# Patient Record
Sex: Male | Born: 1935 | Race: White | Hispanic: No | Smoking: Former smoker
Health system: Southern US, Community
[De-identification: ages and names within clinical notes are randomized; demographics above are authoritative.]

## PROBLEM LIST (undated history)

## (undated) DIAGNOSIS — E039 Hypothyroidism, unspecified: Secondary | ICD-10-CM

## (undated) DIAGNOSIS — F419 Anxiety disorder, unspecified: Secondary | ICD-10-CM

## (undated) DIAGNOSIS — E079 Disorder of thyroid, unspecified: Secondary | ICD-10-CM

## (undated) DIAGNOSIS — C189 Malignant neoplasm of colon, unspecified: Secondary | ICD-10-CM

## (undated) DIAGNOSIS — E119 Type 2 diabetes mellitus without complications: Secondary | ICD-10-CM

## (undated) DIAGNOSIS — F0390 Unspecified dementia without behavioral disturbance: Secondary | ICD-10-CM

## (undated) DIAGNOSIS — I509 Heart failure, unspecified: Secondary | ICD-10-CM

## (undated) DIAGNOSIS — F329 Major depressive disorder, single episode, unspecified: Secondary | ICD-10-CM

## (undated) DIAGNOSIS — E7800 Pure hypercholesterolemia, unspecified: Secondary | ICD-10-CM

## (undated) DIAGNOSIS — I2510 Atherosclerotic heart disease of native coronary artery without angina pectoris: Secondary | ICD-10-CM

## (undated) DIAGNOSIS — I639 Cerebral infarction, unspecified: Secondary | ICD-10-CM

## (undated) DIAGNOSIS — F32A Depression, unspecified: Secondary | ICD-10-CM

## (undated) DIAGNOSIS — Z950 Presence of cardiac pacemaker: Secondary | ICD-10-CM

## (undated) DIAGNOSIS — I10 Essential (primary) hypertension: Secondary | ICD-10-CM

## (undated) HISTORY — PX: COLON SURGERY: SHX602

---

## 1983-03-16 HISTORY — PX: CORONARY ARTERY BYPASS GRAFT: SHX141

## 2008-03-15 HISTORY — PX: CARDIAC CATHETERIZATION: SHX172

## 2008-03-15 HISTORY — PX: INSERT / REPLACE / REMOVE PACEMAKER: SUR710

## 2015-01-14 ENCOUNTER — Emergency Department (HOSPITAL_BASED_OUTPATIENT_CLINIC_OR_DEPARTMENT_OTHER): Payer: Medicare Other

## 2015-01-14 ENCOUNTER — Inpatient Hospital Stay (HOSPITAL_BASED_OUTPATIENT_CLINIC_OR_DEPARTMENT_OTHER)
Admission: EM | Admit: 2015-01-14 | Discharge: 2015-01-16 | DRG: 281 | Disposition: A | Discharge status: Skilled Nursing Facility | Payer: Medicare Other | Attending: Internal Medicine | Admitting: Internal Medicine

## 2015-01-14 ENCOUNTER — Encounter (HOSPITAL_BASED_OUTPATIENT_CLINIC_OR_DEPARTMENT_OTHER): Payer: Self-pay | Admitting: *Deleted

## 2015-01-14 DIAGNOSIS — Z950 Presence of cardiac pacemaker: Secondary | ICD-10-CM

## 2015-01-14 DIAGNOSIS — I779 Disorder of arteries and arterioles, unspecified: Secondary | ICD-10-CM | POA: Diagnosis not present

## 2015-01-14 DIAGNOSIS — I6521 Occlusion and stenosis of right carotid artery: Secondary | ICD-10-CM | POA: Diagnosis present

## 2015-01-14 DIAGNOSIS — Z7902 Long term (current) use of antithrombotics/antiplatelets: Secondary | ICD-10-CM | POA: Diagnosis not present

## 2015-01-14 DIAGNOSIS — Z951 Presence of aortocoronary bypass graft: Secondary | ICD-10-CM | POA: Diagnosis not present

## 2015-01-14 DIAGNOSIS — M79605 Pain in left leg: Secondary | ICD-10-CM | POA: Diagnosis present

## 2015-01-14 DIAGNOSIS — E039 Hypothyroidism, unspecified: Secondary | ICD-10-CM

## 2015-01-14 DIAGNOSIS — Z7982 Long term (current) use of aspirin: Secondary | ICD-10-CM | POA: Diagnosis not present

## 2015-01-14 DIAGNOSIS — Z515 Encounter for palliative care: Secondary | ICD-10-CM | POA: Insufficient documentation

## 2015-01-14 DIAGNOSIS — Z87891 Personal history of nicotine dependence: Secondary | ICD-10-CM

## 2015-01-14 DIAGNOSIS — I482 Chronic atrial fibrillation, unspecified: Secondary | ICD-10-CM

## 2015-01-14 DIAGNOSIS — I480 Paroxysmal atrial fibrillation: Secondary | ICD-10-CM | POA: Diagnosis present

## 2015-01-14 DIAGNOSIS — N400 Enlarged prostate without lower urinary tract symptoms: Secondary | ICD-10-CM | POA: Diagnosis present

## 2015-01-14 DIAGNOSIS — R29898 Other symptoms and signs involving the musculoskeletal system: Secondary | ICD-10-CM

## 2015-01-14 DIAGNOSIS — Z8673 Personal history of transient ischemic attack (TIA), and cerebral infarction without residual deficits: Secondary | ICD-10-CM

## 2015-01-14 DIAGNOSIS — R072 Precordial pain: Secondary | ICD-10-CM | POA: Diagnosis not present

## 2015-01-14 DIAGNOSIS — I110 Hypertensive heart disease with heart failure: Secondary | ICD-10-CM | POA: Diagnosis present

## 2015-01-14 DIAGNOSIS — I5022 Chronic systolic (congestive) heart failure: Secondary | ICD-10-CM | POA: Diagnosis present

## 2015-01-14 DIAGNOSIS — I25708 Atherosclerosis of coronary artery bypass graft(s), unspecified, with other forms of angina pectoris: Secondary | ICD-10-CM | POA: Diagnosis not present

## 2015-01-14 DIAGNOSIS — Z85038 Personal history of other malignant neoplasm of large intestine: Secondary | ICD-10-CM

## 2015-01-14 DIAGNOSIS — I214 Non-ST elevation (NSTEMI) myocardial infarction: Principal | ICD-10-CM | POA: Diagnosis present

## 2015-01-14 DIAGNOSIS — E785 Hyperlipidemia, unspecified: Secondary | ICD-10-CM | POA: Diagnosis present

## 2015-01-14 DIAGNOSIS — I10 Essential (primary) hypertension: Secondary | ICD-10-CM

## 2015-01-14 DIAGNOSIS — F419 Anxiety disorder, unspecified: Secondary | ICD-10-CM | POA: Diagnosis present

## 2015-01-14 DIAGNOSIS — I2510 Atherosclerotic heart disease of native coronary artery without angina pectoris: Secondary | ICD-10-CM | POA: Diagnosis present

## 2015-01-14 DIAGNOSIS — F0280 Dementia in other diseases classified elsewhere without behavioral disturbance: Secondary | ICD-10-CM | POA: Diagnosis not present

## 2015-01-14 DIAGNOSIS — I739 Peripheral vascular disease, unspecified: Secondary | ICD-10-CM

## 2015-01-14 DIAGNOSIS — E119 Type 2 diabetes mellitus without complications: Secondary | ICD-10-CM | POA: Diagnosis present

## 2015-01-14 DIAGNOSIS — F329 Major depressive disorder, single episode, unspecified: Secondary | ICD-10-CM | POA: Diagnosis present

## 2015-01-14 DIAGNOSIS — F0390 Unspecified dementia without behavioral disturbance: Secondary | ICD-10-CM

## 2015-01-14 DIAGNOSIS — G3183 Dementia with Lewy bodies: Secondary | ICD-10-CM | POA: Diagnosis present

## 2015-01-14 DIAGNOSIS — I272 Other secondary pulmonary hypertension: Secondary | ICD-10-CM | POA: Diagnosis not present

## 2015-01-14 DIAGNOSIS — R079 Chest pain, unspecified: Secondary | ICD-10-CM | POA: Diagnosis not present

## 2015-01-14 HISTORY — DX: Major depressive disorder, single episode, unspecified: F32.9

## 2015-01-14 HISTORY — DX: Hypothyroidism, unspecified: E03.9

## 2015-01-14 HISTORY — DX: Essential (primary) hypertension: I10

## 2015-01-14 HISTORY — DX: Unspecified dementia, unspecified severity, without behavioral disturbance, psychotic disturbance, mood disturbance, and anxiety: F03.90

## 2015-01-14 HISTORY — DX: Disorder of thyroid, unspecified: E07.9

## 2015-01-14 HISTORY — DX: Type 2 diabetes mellitus without complications: E11.9

## 2015-01-14 HISTORY — DX: Pure hypercholesterolemia, unspecified: E78.00

## 2015-01-14 HISTORY — DX: Atherosclerotic heart disease of native coronary artery without angina pectoris: I25.10

## 2015-01-14 HISTORY — DX: Presence of cardiac pacemaker: Z95.0

## 2015-01-14 HISTORY — DX: Anxiety disorder, unspecified: F41.9

## 2015-01-14 HISTORY — DX: Heart failure, unspecified: I50.9

## 2015-01-14 HISTORY — DX: Depression, unspecified: F32.A

## 2015-01-14 HISTORY — DX: Cerebral infarction, unspecified: I63.9

## 2015-01-14 HISTORY — DX: Malignant neoplasm of colon, unspecified: C18.9

## 2015-01-14 LAB — CBC
HCT: 40.2 % (ref 39.0–52.0)
HEMOGLOBIN: 13.3 g/dL (ref 13.0–17.0)
MCH: 31 pg (ref 26.0–34.0)
MCHC: 33.1 g/dL (ref 30.0–36.0)
MCV: 93.7 fL (ref 78.0–100.0)
Platelets: 154 10*3/uL (ref 150–400)
RBC: 4.29 MIL/uL (ref 4.22–5.81)
RDW: 15.8 % — ABNORMAL HIGH (ref 11.5–15.5)
WBC: 7.1 10*3/uL (ref 4.0–10.5)

## 2015-01-14 LAB — URINALYSIS, ROUTINE W REFLEX MICROSCOPIC
Bilirubin Urine: NEGATIVE
GLUCOSE, UA: NEGATIVE mg/dL
HGB URINE DIPSTICK: NEGATIVE
Ketones, ur: NEGATIVE mg/dL
Nitrite: NEGATIVE
PH: 6 (ref 5.0–8.0)
Protein, ur: NEGATIVE mg/dL
Specific Gravity, Urine: 1.019 (ref 1.005–1.030)
Urobilinogen, UA: 0.2 mg/dL (ref 0.0–1.0)

## 2015-01-14 LAB — URINE MICROSCOPIC-ADD ON

## 2015-01-14 LAB — CBC WITH DIFFERENTIAL/PLATELET
Basophils Absolute: 0.1 10*3/uL (ref 0.0–0.1)
Basophils Relative: 1 %
Eosinophils Absolute: 0.2 10*3/uL (ref 0.0–0.7)
Eosinophils Relative: 2 %
HEMATOCRIT: 39.8 % (ref 39.0–52.0)
HEMOGLOBIN: 13.2 g/dL (ref 13.0–17.0)
LYMPHS ABS: 2.2 10*3/uL (ref 0.7–4.0)
Lymphocytes Relative: 30 %
MCH: 30.8 pg (ref 26.0–34.0)
MCHC: 33.2 g/dL (ref 30.0–36.0)
MCV: 92.8 fL (ref 78.0–100.0)
MONO ABS: 0.7 10*3/uL (ref 0.1–1.0)
MONOS PCT: 10 %
NEUTROS ABS: 4.2 10*3/uL (ref 1.7–7.7)
NEUTROS PCT: 57 %
Platelets: 161 10*3/uL (ref 150–400)
RBC: 4.29 MIL/uL (ref 4.22–5.81)
RDW: 15.9 % — AB (ref 11.5–15.5)
WBC: 7.3 10*3/uL (ref 4.0–10.5)

## 2015-01-14 LAB — COMPREHENSIVE METABOLIC PANEL
ALBUMIN: 3.6 g/dL (ref 3.5–5.0)
ALK PHOS: 129 U/L — AB (ref 38–126)
ALT: 19 U/L (ref 17–63)
AST: 28 U/L (ref 15–41)
Anion gap: 8 (ref 5–15)
BILIRUBIN TOTAL: 0.8 mg/dL (ref 0.3–1.2)
BUN: 18 mg/dL (ref 6–20)
CHLORIDE: 107 mmol/L (ref 101–111)
CO2: 25 mmol/L (ref 22–32)
CREATININE: 1.07 mg/dL (ref 0.61–1.24)
Calcium: 9.1 mg/dL (ref 8.9–10.3)
GFR calc Af Amer: >60 mL/min (ref >60)
GFR calc non Af Amer: 59 mL/min — ABNORMAL LOW (ref >60)
Glucose, Bld: 92 mg/dL (ref 65–99)
POTASSIUM: 4.2 mmol/L (ref 3.5–5.1)
Sodium: 140 mmol/L (ref 135–145)
Total Protein: 7.3 g/dL (ref 6.5–8.1)

## 2015-01-14 LAB — CREATININE, SERUM
CREATININE: 0.91 mg/dL (ref 0.61–1.24)
GFR calc Af Amer: >60 mL/min (ref >60)

## 2015-01-14 LAB — DIGOXIN LEVEL: Digoxin Level: 0.8 ng/mL (ref 0.8–2.0)

## 2015-01-14 LAB — PROTIME-INR
INR: 1.27 (ref 0.00–1.49)
Prothrombin Time: 16 seconds — ABNORMAL HIGH (ref 11.6–15.2)

## 2015-01-14 LAB — T4, FREE: FREE T4: 0.84 ng/dL (ref 0.61–1.12)

## 2015-01-14 LAB — TSH: TSH: 4.233 u[IU]/mL (ref 0.350–4.500)

## 2015-01-14 LAB — APTT: aPTT: 36 seconds (ref 24–37)

## 2015-01-14 LAB — TROPONIN I
TROPONIN I: 0.04 ng/mL — AB (ref <0.031)
Troponin I: 0.03 ng/mL (ref <0.031)

## 2015-01-14 LAB — CBG MONITORING, ED: Glucose-Capillary: 84 mg/dL (ref 65–99)

## 2015-01-14 MED ORDER — CLOPIDOGREL BISULFATE 75 MG PO TABS
75.0000 mg | ORAL_TABLET | Freq: Every day | ORAL | Status: DC
  Administered 2015-01-15 – 2015-01-16 (×2): 75 mg via ORAL
  Filled 2015-01-14 (×2): qty 1

## 2015-01-14 MED ORDER — QUETIAPINE FUMARATE 25 MG PO TABS
25.0000 mg | ORAL_TABLET | Freq: Three times a day (TID) | ORAL | Status: DC
  Administered 2015-01-14 – 2015-01-15 (×4): 25 mg via ORAL
  Filled 2015-01-14 (×5): qty 1

## 2015-01-14 MED ORDER — TAMSULOSIN HCL 0.4 MG PO CAPS
0.4000 mg | ORAL_CAPSULE | Freq: Every day | ORAL | Status: DC
  Administered 2015-01-14 – 2015-01-15 (×2): 0.4 mg via ORAL
  Filled 2015-01-14 (×2): qty 1

## 2015-01-14 MED ORDER — ONDANSETRON HCL 4 MG/2ML IJ SOLN: 4.0000 mg | Freq: Four times a day (QID) | INTRAMUSCULAR | Status: DC | PRN

## 2015-01-14 MED ORDER — ZOLPIDEM TARTRATE 5 MG PO TABS: 5.0000 mg | ORAL_TABLET | Freq: Every evening | ORAL | Status: DC | PRN

## 2015-01-14 MED ORDER — SODIUM CHLORIDE 0.9 % IV SOLN
INTRAVENOUS | Status: DC
  Administered 2015-01-14: 23:00:00 via INTRAVENOUS

## 2015-01-14 MED ORDER — NITROGLYCERIN 0.4 MG SL SUBL: 0.4000 mg | SUBLINGUAL_TABLET | SUBLINGUAL | Status: DC | PRN

## 2015-01-14 MED ORDER — ACETAMINOPHEN 325 MG PO TABS: 650.0000 mg | ORAL_TABLET | ORAL | Status: DC | PRN

## 2015-01-14 MED ORDER — CITALOPRAM HYDROBROMIDE 20 MG PO TABS
40.0000 mg | ORAL_TABLET | Freq: Every day | ORAL | Status: DC
  Administered 2015-01-15: 40 mg via ORAL
  Filled 2015-01-14: qty 2

## 2015-01-14 MED ORDER — ATORVASTATIN CALCIUM 20 MG PO TABS
20.0000 mg | ORAL_TABLET | Freq: Every day | ORAL | Status: DC
  Administered 2015-01-15 – 2015-01-16 (×2): 20 mg via ORAL
  Filled 2015-01-14 (×2): qty 1

## 2015-01-14 MED ORDER — HEPARIN SODIUM (PORCINE) 5000 UNIT/ML IJ SOLN
5000.0000 [IU] | Freq: Three times a day (TID) | INTRAMUSCULAR | Status: DC
  Administered 2015-01-14 – 2015-01-16 (×5): 5000 [IU] via SUBCUTANEOUS
  Filled 2015-01-14 (×5): qty 1

## 2015-01-14 MED ORDER — FUROSEMIDE 20 MG PO TABS
20.0000 mg | ORAL_TABLET | Freq: Every day | ORAL | Status: DC
  Administered 2015-01-15 – 2015-01-16 (×2): 20 mg via ORAL
  Filled 2015-01-14 (×2): qty 1

## 2015-01-14 MED ORDER — SODIUM CHLORIDE 0.9 % IV BOLUS (SEPSIS): 1000.0000 mL | Freq: Once | INTRAVENOUS | Status: DC

## 2015-01-14 MED ORDER — DIGOXIN 250 MCG PO TABS: 0.2500 mg | ORAL_TABLET | Freq: Every day | ORAL | Status: DC

## 2015-01-14 MED ORDER — ASPIRIN EC 81 MG PO TBEC
81.0000 mg | DELAYED_RELEASE_TABLET | Freq: Every day | ORAL | Status: DC
  Administered 2015-01-15 – 2015-01-16 (×2): 81 mg via ORAL
  Filled 2015-01-14 (×2): qty 1

## 2015-01-14 MED ORDER — LEVOTHYROXINE SODIUM 50 MCG PO TABS
50.0000 ug | ORAL_TABLET | Freq: Every day | ORAL | Status: DC
  Administered 2015-01-15 – 2015-01-16 (×2): 50 ug via ORAL
  Filled 2015-01-14 (×2): qty 1

## 2015-01-14 NOTE — ED Notes (Signed)
Patient transported to X-ray 

## 2015-01-14 NOTE — H&P (Addendum)
[facility] History and Physical  Kenneth Parrish EAV:[id number] DOB: January 16, 1924 DOA: 01/14/2015  Referring physician: Debby Freiberg, MD PCP: No primary care provider on file.   Chief Complaint: Abnormal Troponin  HPI: Kenneth Parrish is a 81 y.o. male with history of dementia DM II HTN presents with an abnormal troponin. Patients daughter statrs that today the patient was at home and started to complain of having pain in his foot. Patients caregiver today noted that some thing was wrong when his caregiver was giving him a shower. Patients leg apparently gave oout and he states to complain of having left side pain. Patients daughter is not sure if he was having chest pain. Patient was also noted to have a low blood pressure. He was not able to really provide a history due to his dementia. Patient was initially evaluated for his foot pain. His daughter asked for an orthostatic pressure and was asked to walk to the bathroom. At that time his caregiver noted that he went limp. He did not lose consciousness and had no complaint of chest pain. Patient did have some shortness of breath noted. Patient had no headaches noted. He only had this vague complaint of pain on his left side. He has a history of Stage 4 colon Ca back in 1997. The dauhgter hs noted increased episodes of falls and his increased weakness. Patient right now lives at home with aides helping. She however wants to consider short term rehab as his level of care is increasing.    Review of Systems:  Patietn is not able to provide a ROS due to his dementia.   Past Medical History  Diagnosis Date  . Dementia   . Colon cancer ([place])   . Diabetes mellitus without complication ([place])   . High cholesterol   . Hypertension   . Thyroid disease    No past surgical history on file. Social History:  has no tobacco, alcohol, and drug history on file.  No Known Allergies  No family history on file.   Prior to Admission medications     Medication Sig Start Date End Date Taking? Authorizing Provider  aspirin 81 MG tablet Take 81 mg by mouth daily.   Yes Historical Provider, MD  atorvastatin (LIPITOR) 40 MG tablet Take 20 mg by mouth daily.   Yes Historical Provider, MD  citalopram (CELEXA) 40 MG tablet Take 40 mg by mouth daily.   Yes Historical Provider, MD  clopidogrel (PLAVIX) 75 MG tablet Take 75 mg by mouth daily.   Yes Historical Provider, MD  digoxin (LANOXIN) 0.25 MG tablet Take 0.25 mg by mouth daily.   Yes Historical Provider, MD  furosemide (LASIX) 20 MG tablet Take 20 mg by mouth as needed.   Yes Historical Provider, MD  levothyroxine (SYNTHROID, LEVOTHROID) 50 MCG tablet Take 50 mcg by mouth daily before breakfast.   Yes Historical Provider, MD  Melatonin 5 MG CAPS Take by mouth.   Yes Historical Provider, MD  Multiple Vitamins-Minerals (CENTRUM SILVER PO) Take by mouth.   Yes Historical Provider, MD  QUEtiapine (SEROQUEL) 25 MG tablet Take 25 mg by mouth 3 times/day as needed-between meals & bedtime.   Yes Historical Provider, MD  tamsulosin (FLOMAX) 0.4 MG CAPS capsule Take 0.4 mg by mouth Nightly.   Yes Historical Provider, MD   Physical Exam: Filed Vitals:   01/14/15 1251 01/14/15 1634 01/14/15 1915 01/14/15 2100  BP: 102/60  118/72 144/76  Pulse: 70  73 77  Temp: 98.5 F (36.9 C)  99.6 F (37.6 C)  TempSrc: Oral   Oral  Resp: 18 20  22   Height: 5\' 6"  (1.676 m)   5\' 8"  (1.727 m)  Weight: 80.287 kg (177 lb)   78.6 kg (173 lb 4.5 oz)  SpO2: 96% 93% 96% 97%    Wt Readings from Last 3 Encounters:  01/14/15 78.6 kg (173 lb 4.5 oz)    General:  Appears calm and comfortable Eyes: PERRL, normal lids, irises & conjunctiva ENT: grossly normal hearing, lips & tongue Neck: no LAD, masses or thyromegaly Cardiovascular: RRR, no m/r/g. No LE edema. Respiratory: CTA bilaterally, no w/r/r. Normal respiratory effort. Abdomen: soft, ntnd Skin: no rash or induration seen on limited exam Musculoskeletal:  grossly normal tone BUE/BLE Psychiatric: awake pleasant confused Neurologic: moves all four extremities          Labs on Admission:  Basic Metabolic Panel:  Recent Labs Lab 01/14/15 1520  NA 140  K 4.2  CL 107  CO2 25  GLUCOSE 92  BUN 18  CREATININE 1.07  CALCIUM 9.1   Liver Function Tests:  Recent Labs Lab 01/14/15 1520  AST 28  ALT 19  ALKPHOS 129*  BILITOT 0.8  PROT 7.3  ALBUMIN 3.6   No results for input(s): LIPASE, AMYLASE in the last 168 hours. No results for input(s): AMMONIA in the last 168 hours. CBC:  Recent Labs Lab 01/14/15 1520  WBC 7.3  NEUTROABS 4.2  HGB 13.2  HCT 39.8  MCV 92.8  PLT 161   Cardiac Enzymes:  Recent Labs Lab 01/14/15 1520  TROPONINI 0.04*    BNP (last 3 results) No results for input(s): BNP in the last 8760 hours.  ProBNP (last 3 results) No results for input(s): PROBNP in the last 8760 hours.  CBG:  Recent Labs Lab 01/14/15 1534  GLUCAP 84    Radiological Exams on Admission: Dg Chest 2 View  01/14/2015  CLINICAL DATA:  Altered mental status. History of colon carcinoma. Hypertension. EXAM: CHEST  2 VIEW COMPARISON:  None. FINDINGS: There is cardiomegaly with small pleural effusions bilaterally. No edema or consolidation. Pacemaker lead is attached to the right ventricle. No pneumothorax. No adenopathy. There is slight pulmonary venous hypertension. There is atherosclerotic calcification in the aorta. There is degenerative change in the thoracic spine. IMPRESSION: Evidence of pulmonary vascular congestion. Small pleural effusions bilaterally. No frank edema or consolidation. Electronically Signed   By: Lowella Grip III M.D.   On: 01/14/2015 16:12   Dg Ankle Complete Left  01/14/2015  CLINICAL DATA:  Pain.  No history of recent trauma EXAM: LEFT ANKLE COMPLETE - 3+ VIEW COMPARISON:  None. FINDINGS: Frontal, oblique and lateral views were obtained. There is no demonstrable fracture or joint effusion. The ankle  mortise appears intact. There are small spurs arising from the posterior inferior calcaneus. There is narrowing in the medial aspect of the joint. There are multiple foci of arterial vascular calcification. IMPRESSION: Osteoarthritic change. Small calcaneal spurs. Extensive arterial vascular calcification. No fracture. Ankle mortise appears intact. Electronically Signed   By: Lowella Grip III M.D.   On: 01/14/2015 16:13   Ct Head Wo Contrast  01/14/2015  CLINICAL DATA:  81 year old diabetic hypertensive male with weakness and lower extremity pain. Initial encounter. EXAM: CT HEAD WITHOUT CONTRAST TECHNIQUE: Contiguous axial images were obtained from the base of the skull through the vertex without intravenous contrast. COMPARISON:  None. FINDINGS: No skull fracture or intracranial hemorrhage. Right thalamic infarcts of indeterminate age, possibly remote. Remote  left basal ganglia infarcts. Small vessel disease type changes without CT evidence of large acute infarct. Global atrophy without hydrocephalus. No intracranial mass lesion noted on this unenhanced exam. Vascular calcifications. IMPRESSION: Right thalamic infarcts of indeterminate age, possibly remote. Remote left basal ganglia infarcts. Small vessel disease type changes without CT evidence of large acute infarct. Global atrophy without hydrocephalus. Electronically Signed   By: Genia Del M.D.   On: 01/14/2015 16:10   US Venous Img Lower Unilateral Left  01/14/2015  CLINICAL DATA:  Left lower extremity pain for the past 2 hours. History of malignancy. Evaluate for DVT. EXAM: LEFT LOWER EXTREMITY VENOUS DOPPLER ULTRASOUND TECHNIQUE: Gray-scale sonography with graded compression, as well as color Doppler and duplex ultrasound were performed to evaluate the lower extremity deep venous systems from the level of the common femoral vein and including the common femoral, femoral, profunda femoral, popliteal and calf veins including the posterior  tibial, peroneal and gastrocnemius veins when visible. The superficial great saphenous vein was also interrogated. Spectral Doppler was utilized to evaluate flow at rest and with distal augmentation maneuvers in the common femoral, femoral and popliteal veins. COMPARISON:  Left foot radiographs - earlier same day FINDINGS: Contralateral Common Femoral Vein: Respiratory phasicity is normal and symmetric with the symptomatic side. No evidence of thrombus. Normal compressibility. Common Femoral Vein: No evidence of thrombus. Normal compressibility, respiratory phasicity and response to augmentation. Saphenofemoral Junction: No evidence of thrombus. Normal compressibility and flow on color Doppler imaging. Profunda Femoral Vein: No evidence of thrombus. Normal compressibility and flow on color Doppler imaging. Femoral Vein: No evidence of thrombus. Normal compressibility, respiratory phasicity and response to augmentation. Popliteal Vein: No evidence of thrombus. Normal compressibility, respiratory phasicity and response to augmentation. Calf Veins: No evidence of thrombus. Normal compressibility and flow on color Doppler imaging. Superficial Great Saphenous Vein: No evidence of thrombus. Normal compressibility and flow on color Doppler imaging. Venous Reflux:  None. Other Findings:  None. IMPRESSION: No evidence of DVT within the left lower extremity. Electronically Signed   By: Sandi Mariscal M.D.   On: 01/14/2015 16:43      Assessment/Plan Principal Problem:   NSTEMI, initial episode of care [facility]) Active Problems:   HTN (hypertension)   Hypothyroidism   Diabetes mellitus without complication ([place])   Hyperlipidemia   Dementia   1. Elevated troponin/ possible NSTEMI -will be admitted to telemetry -will get serial enzymes -will get cardiology consultation -ECG in am -echo in am  2. HTN -will monitor pressures -continue with lasix  3. Hypothyroidism -patient will continue with synthroid -will  check TSH  4. DM Type II -will get A1C -will check FSBS  -SSI as needed  5. Hyperlipidemia -continue with statins -will check lipid profile  6. Dementia -continue with seroquel     Code Status: full code (must indicate code status--if unknown or must be presumed, indicate so) DVT Prophylaxis:heparin Family Communication: none (indicate person spoken with, if applicable, with phone number if by telephone) Disposition Plan: home (indicate anticipated LOS)    [facility] [place] [facility] Pager [phone]   Addendum Made 04/20/15 Patient does NOT have a diagnosis of Lewy Body Dementia

## 2015-01-14 NOTE — ED Notes (Signed)
Attempt to call report to [place] not available

## 2015-01-14 NOTE — ED Notes (Signed)
Patient c/o L foot pain X 2 hours, ambulates with walker

## 2015-01-14 NOTE — ED Provider Notes (Signed)
CSN: [id number]     Arrival date & time 01/14/15  1250 History   First MD Initiated Contact with Patient 01/14/15 1445     Chief Complaint  Patient presents with  . Foot Pain     (Consider location/radiation/quality/duration/timing/severity/associated sxs/prior Treatment) Patient is a 81 y.o. male presenting with lower extremity pain.  Foot Pain This is a new problem. The current episode started 1 to 2 hours ago. The problem occurs constantly. The problem has not changed since onset.Pertinent negatives include no chest pain, no abdominal pain, no headaches and no shortness of breath. The symptoms are aggravated by walking. Nothing relieves the symptoms. He has tried nothing for the symptoms.   Level 5 caveat for dementia Past Medical History  Diagnosis Date  . Dementia   . Colon cancer ([place])   . Diabetes mellitus without complication ([place])   . High cholesterol   . Hypertension   . Thyroid disease   . Coronary artery disease   . Presence of permanent cardiac pacemaker   . Stroke ([place])   . CHF (congestive heart failure) ([place])   . Depression   . Anxiety   . Hypothyroidism    Past Surgical History  Procedure Laterality Date  . Coronary artery bypass graft  1985  . Cardiac catheterization  2010    with  . Insert / replace / remove pacemaker  2010  . Colon surgery     History reviewed. No pertinent family history. Social History  Substance Use Topics  . Smoking status: Former Smoker    Types: Cigars  . Smokeless tobacco: None  . Alcohol Use: No    Review of Systems  Unable to perform ROS: Dementia  Respiratory: Negative for shortness of breath.   Cardiovascular: Negative for chest pain.  Gastrointestinal: Negative for abdominal pain.  Neurological: Negative for headaches.      Allergies  Review of patient's allergies indicates no known allergies.  Home Medications   Prior to Admission medications   Medication Sig Start Date End Date Taking? Authorizing  Provider  aspirin 81 MG tablet Take 81 mg by mouth daily.   Yes Historical Provider, MD  atorvastatin (LIPITOR) 40 MG tablet Take 20 mg by mouth daily.   Yes Historical Provider, MD  citalopram (CELEXA) 40 MG tablet Take 20 mg by mouth daily.    Yes Historical Provider, MD  clopidogrel (PLAVIX) 75 MG tablet Take 75 mg by mouth daily.   Yes Historical Provider, MD  digoxin (LANOXIN) 0.25 MG tablet Take 0.25 mg by mouth daily.   Yes Historical Provider, MD  furosemide (LASIX) 20 MG tablet Take 20 mg by mouth as needed.   Yes Historical Provider, MD  levothyroxine (SYNTHROID, LEVOTHROID) 50 MCG tablet Take 50 mcg by mouth daily before breakfast.   Yes Historical Provider, MD  Melatonin 5 MG CAPS Take by mouth.   Yes Historical Provider, MD  Multiple Vitamins-Minerals (CENTRUM SILVER PO) Take by mouth.   Yes Historical Provider, MD  QUEtiapine (SEROQUEL) 25 MG tablet Take 25 mg by mouth 3 times/day as needed-between meals & bedtime.   Yes Historical Provider, MD  tamsulosin (FLOMAX) 0.4 MG CAPS capsule Take 0.4 mg by mouth Nightly.   Yes Historical Provider, MD   BP 102/57 mmHg  Pulse 64  Temp(Src) 98.8 F (37.1 C) (Oral)  Resp 18  Ht 5\' 8"  (1.727 m)  Wt 172 lb (78.019 kg)  BMI 26.16 kg/m2  SpO2 93% Physical Exam  Constitutional: He appears well-developed and well-nourished.  HENT:  Head: Normocephalic and atraumatic.  Eyes: Conjunctivae and EOM are normal.  Neck: Normal range of motion. Neck supple.  Cardiovascular: Normal rate, regular rhythm and normal heart sounds.   Pulmonary/Chest: Effort normal and breath sounds normal. No respiratory distress.  Abdominal: He exhibits no distension. There is no tenderness. There is no rebound and no guarding.  Musculoskeletal: Normal range of motion.       Left ankle: Normal.       Left lower leg: Normal.       Left foot: Normal.  Neurological: He is alert. He has normal strength. No cranial nerve deficit or sensory deficit. Gait abnormal.   Initially normal gait, however briefly thereafter very hesitant gait, no lateralizing symptoms, no focal neuro deficits  Skin: Skin is warm and dry.  Vitals reviewed.   ED Course  Procedures (including critical care time) Labs Review Labs Reviewed  URINALYSIS, ROUTINE W REFLEX MICROSCOPIC (NOT AT [facility]) - Abnormal; Notable for the following:    Leukocytes, UA SMALL (*)    All other components within normal limits  CBC WITH DIFFERENTIAL/PLATELET - Abnormal; Notable for the following:    RDW 15.9 (*)    All other components within normal limits  COMPREHENSIVE METABOLIC PANEL - Abnormal; Notable for the following:    Alkaline Phosphatase 129 (*)    GFR calc non Af Amer 59 (*)    All other components within normal limits  TROPONIN I - Abnormal; Notable for the following:    Troponin I 0.04 (*)    All other components within normal limits  CBC - Abnormal; Notable for the following:    RDW 15.8 (*)    All other components within normal limits  TROPONIN I - Abnormal; Notable for the following:    Troponin I 0.04 (*)    All other components within normal limits  PROTIME-INR - Abnormal; Notable for the following:    Prothrombin Time 16.0 (*)    All other components within normal limits  CBC - Abnormal; Notable for the following:    RBC 4.01 (*)    Hemoglobin 12.2 (*)    HCT 37.1 (*)    RDW 15.7 (*)    Platelets 146 (*)    All other components within normal limits  BASIC METABOLIC PANEL - Abnormal; Notable for the following:    Sodium 133 (*)    All other components within normal limits  LIPID PANEL - Abnormal; Notable for the following:    HDL 36 (*)    All other components within normal limits  GLUCOSE, CAPILLARY - Abnormal; Notable for the following:    Glucose-Capillary 105 (*)    All other components within normal limits  GLUCOSE, CAPILLARY - Abnormal; Notable for the following:    Glucose-Capillary 127 (*)    All other components within normal limits  URINE  MICROSCOPIC-ADD ON  CREATININE, SERUM  T4, FREE  TSH  TROPONIN I  TROPONIN I  APTT  DIGOXIN LEVEL  GLUCOSE, CAPILLARY  CBG MONITORING, ED    Imaging Review Dg Chest 2 View  01/14/2015  CLINICAL DATA:  Altered mental status. History of colon carcinoma. Hypertension. EXAM: CHEST  2 VIEW COMPARISON:  None. FINDINGS: There is cardiomegaly with small pleural effusions bilaterally. No edema or consolidation. Pacemaker lead is attached to the right ventricle. No pneumothorax. No adenopathy. There is slight pulmonary venous hypertension. There is atherosclerotic calcification in the aorta. There is degenerative change in the thoracic spine. IMPRESSION: Evidence of pulmonary vascular  congestion. Small pleural effusions bilaterally. No frank edema or consolidation. Electronically Signed   By: Lowella Grip III M.D.   On: 01/14/2015 16:12   Dg Ankle Complete Left  01/14/2015  CLINICAL DATA:  Pain.  No history of recent trauma EXAM: LEFT ANKLE COMPLETE - 3+ VIEW COMPARISON:  None. FINDINGS: Frontal, oblique and lateral views were obtained. There is no demonstrable fracture or joint effusion. The ankle mortise appears intact. There are small spurs arising from the posterior inferior calcaneus. There is narrowing in the medial aspect of the joint. There are multiple foci of arterial vascular calcification. IMPRESSION: Osteoarthritic change. Small calcaneal spurs. Extensive arterial vascular calcification. No fracture. Ankle mortise appears intact. Electronically Signed   By: Lowella Grip III M.D.   On: 01/14/2015 16:13   Ct Head Wo Contrast  01/14/2015  CLINICAL DATA:  81 year old diabetic hypertensive male with weakness and lower extremity pain. Initial encounter. EXAM: CT HEAD WITHOUT CONTRAST TECHNIQUE: Contiguous axial images were obtained from the base of the skull through the vertex without intravenous contrast. COMPARISON:  None. FINDINGS: No skull fracture or intracranial hemorrhage. Right  thalamic infarcts of indeterminate age, possibly remote. Remote left basal ganglia infarcts. Small vessel disease type changes without CT evidence of large acute infarct. Global atrophy without hydrocephalus. No intracranial mass lesion noted on this unenhanced exam. Vascular calcifications. IMPRESSION: Right thalamic infarcts of indeterminate age, possibly remote. Remote left basal ganglia infarcts. Small vessel disease type changes without CT evidence of large acute infarct. Global atrophy without hydrocephalus. Electronically Signed   By: Genia Del M.D.   On: 01/14/2015 16:10   US Venous Img Lower Unilateral Left  01/14/2015  CLINICAL DATA:  Left lower extremity pain for the past 2 hours. History of malignancy. Evaluate for DVT. EXAM: LEFT LOWER EXTREMITY VENOUS DOPPLER ULTRASOUND TECHNIQUE: Gray-scale sonography with graded compression, as well as color Doppler and duplex ultrasound were performed to evaluate the lower extremity deep venous systems from the level of the common femoral vein and including the common femoral, femoral, profunda femoral, popliteal and calf veins including the posterior tibial, peroneal and gastrocnemius veins when visible. The superficial great saphenous vein was also interrogated. Spectral Doppler was utilized to evaluate flow at rest and with distal augmentation maneuvers in the common femoral, femoral and popliteal veins. COMPARISON:  Left foot radiographs - earlier same day FINDINGS: Contralateral Common Femoral Vein: Respiratory phasicity is normal and symmetric with the symptomatic side. No evidence of thrombus. Normal compressibility. Common Femoral Vein: No evidence of thrombus. Normal compressibility, respiratory phasicity and response to augmentation. Saphenofemoral Junction: No evidence of thrombus. Normal compressibility and flow on color Doppler imaging. Profunda Femoral Vein: No evidence of thrombus. Normal compressibility and flow on color Doppler imaging.  Femoral Vein: No evidence of thrombus. Normal compressibility, respiratory phasicity and response to augmentation. Popliteal Vein: No evidence of thrombus. Normal compressibility, respiratory phasicity and response to augmentation. Calf Veins: No evidence of thrombus. Normal compressibility and flow on color Doppler imaging. Superficial Great Saphenous Vein: No evidence of thrombus. Normal compressibility and flow on color Doppler imaging. Venous Reflux:  None. Other Findings:  None. IMPRESSION: No evidence of DVT within the left lower extremity. Electronically Signed   By: Sandi Mariscal M.D.   On: 01/14/2015 16:43   I have personally reviewed and evaluated these images and lab results as part of my medical decision-making.   EKG Interpretation   Date/Time:  Tuesday January 14 2015 17:19:40 EDT Ventricular Rate:  71 PR Interval:  QRS Duration: 210 QT Interval:  490 QTC Calculation: 532 R Axis:   -62 Text Interpretation:  Ventricular-paced rhythm with frequent Premature  ventricular complexes Abnormal ECG ED PHYSICIAN INTERPRETATION AVAILABLE  IN [facility] Confirmed by TEST, Record ([id number]) on 01/15/2015 7:02:30  AM      MDM   Final diagnoses:  Carotid artery disease ([facility])  Weakness of foot, left    81 y.o. male with pertinent PMH of dementia, st 4 colon ca presents with pain in L foot, atraumatic to best of knowledge.  Initially exam without tenderness or abnormality, however then pt very hesitant to walk.  Unable to elicit tenderness or pain, however exam very limited by mental status.    Wu as above with positive trop.  Consulted cardiology and medicine for admission.  Admitted in stable condition  I have reviewed all laboratory and imaging studies if ordered as above  1. Carotid artery disease ([place])   2. Weakness of foot, left         Debby Freiberg, MD 01/16/15 1000

## 2015-01-14 NOTE — ED Notes (Signed)
Patient transported to CT 

## 2015-01-15 ENCOUNTER — Inpatient Hospital Stay (HOSPITAL_COMMUNITY): Payer: Medicare Other

## 2015-01-15 ENCOUNTER — Encounter (HOSPITAL_COMMUNITY): Payer: Self-pay | Admitting: *Deleted

## 2015-01-15 DIAGNOSIS — R079 Chest pain, unspecified: Secondary | ICD-10-CM

## 2015-01-15 DIAGNOSIS — R072 Precordial pain: Secondary | ICD-10-CM

## 2015-01-15 DIAGNOSIS — I25708 Atherosclerosis of coronary artery bypass graft(s), unspecified, with other forms of angina pectoris: Secondary | ICD-10-CM

## 2015-01-15 DIAGNOSIS — E039 Hypothyroidism, unspecified: Secondary | ICD-10-CM

## 2015-01-15 DIAGNOSIS — I10 Essential (primary) hypertension: Secondary | ICD-10-CM

## 2015-01-15 DIAGNOSIS — F0280 Dementia in other diseases classified elsewhere without behavioral disturbance: Secondary | ICD-10-CM

## 2015-01-15 DIAGNOSIS — E119 Type 2 diabetes mellitus without complications: Secondary | ICD-10-CM

## 2015-01-15 DIAGNOSIS — G3183 Dementia with Lewy bodies: Secondary | ICD-10-CM

## 2015-01-15 LAB — BASIC METABOLIC PANEL
ANION GAP: 7 (ref 5–15)
BUN: 13 mg/dL (ref 6–20)
CALCIUM: 8.9 mg/dL (ref 8.9–10.3)
CO2: 22 mmol/L (ref 22–32)
Chloride: 104 mmol/L (ref 101–111)
Creatinine, Ser: 0.94 mg/dL (ref 0.61–1.24)
GFR calc Af Amer: >60 mL/min (ref >60)
GLUCOSE: 80 mg/dL (ref 65–99)
Potassium: 3.9 mmol/L (ref 3.5–5.1)
Sodium: 133 mmol/L — ABNORMAL LOW (ref 135–145)

## 2015-01-15 LAB — GLUCOSE, CAPILLARY
Glucose-Capillary: 82 mg/dL (ref 65–99)
Glucose-Capillary: 105 mg/dL — ABNORMAL HIGH (ref 65–99)
Glucose-Capillary: 127 mg/dL — ABNORMAL HIGH (ref 65–99)

## 2015-01-15 LAB — CBC
HEMATOCRIT: 37.1 % — AB (ref 39.0–52.0)
Hemoglobin: 12.2 g/dL — ABNORMAL LOW (ref 13.0–17.0)
MCH: 30.4 pg (ref 26.0–34.0)
MCHC: 32.9 g/dL (ref 30.0–36.0)
MCV: 92.5 fL (ref 78.0–100.0)
PLATELETS: 146 10*3/uL — AB (ref 150–400)
RBC: 4.01 MIL/uL — ABNORMAL LOW (ref 4.22–5.81)
RDW: 15.7 % — AB (ref 11.5–15.5)
WBC: 6.4 10*3/uL (ref 4.0–10.5)

## 2015-01-15 LAB — TROPONIN I
Troponin I: 0.03 ng/mL (ref <0.031)
Troponin I: 0.04 ng/mL — ABNORMAL HIGH (ref <0.031)

## 2015-01-15 LAB — LIPID PANEL
CHOL/HDL RATIO: 3.1 ratio
Cholesterol: 113 mg/dL (ref 0–200)
HDL: 36 mg/dL — AB (ref >40)
LDL CALC: 66 mg/dL (ref 0–99)
Triglycerides: 55 mg/dL (ref <150)
VLDL: 11 mg/dL (ref 0–40)

## 2015-01-15 MED ORDER — DIGOXIN 125 MCG PO TABS
0.1250 mg | ORAL_TABLET | Freq: Every day | ORAL | Status: DC
  Administered 2015-01-15 – 2015-01-16 (×2): 0.125 mg via ORAL
  Filled 2015-01-15 (×2): qty 1

## 2015-01-15 NOTE — Consult Note (Addendum)
CARDIOLOGY CONSULT NOTE   Patient ID: Kenneth Parrish MRN: [id number] DOB/AGE: [birth year] 81 y.o.  Admit date: 01/14/2015  Primary Physician   No primary care provider on file. Primary Cardiologist   Baxter Hire ([facility]) Reason for Consultation  Elevated troponin  HPI: Kenneth Parrish is a 81 y.o. male with a history of CABG (1985), CHF,  HTN, HL, Stroke, hypothyroidism, pacemaker placement, DM, stage 4 colon ca  and Dementia who consulted for abnormal troponin.  All the history obtained from daughter at bed site and H&P. The patient has a history of CABG in 1985. Last cath was approximately 6 years ago with a stent placement. Per daughter patient has a severe heart disease. The daughter lives in [place] approximately one hour away. The patient lives in [place] with 3 different caregivers all the time. The patient was started on Seroquel Friday, 01/12/15. The patient was agitated and combative over the weekend.  Monday daughter came to visit her father. At that time the patient was not agitated and combative. He does have occasional shortness of breath. The patient is very limited to ambulation. Tuesday 01/14/15 while walking with caregiver and daughter while to take shower patient's left leg gave out. At that time there was also question about patient has a left-sided chest pain.   Troponin 0.04->0.03->0.03. TSH normal, CT head without acute abnormality, LE doppler without DVT. Currently denies any pain.    Past Medical History  Diagnosis Date  . Dementia   . Colon cancer ([place])   . Diabetes mellitus without complication ([place])   . High cholesterol   . Hypertension   . Thyroid disease   . Coronary artery disease   . Presence of permanent cardiac pacemaker   . Stroke ([place])   . CHF (congestive heart failure) ([place])   . Depression   . Anxiety   . Hypothyroidism      Past Surgical History  Procedure Laterality Date  . Coronary artery  bypass graft  1985  . Cardiac catheterization  2010    with  . Insert / replace / remove pacemaker  2010  . Colon surgery      No Known Allergies  I have reviewed the patient's current medications . aspirin EC  81 mg Oral Daily  . atorvastatin  20 mg Oral Daily  . citalopram  40 mg Oral Daily  . clopidogrel  75 mg Oral Daily  . digoxin  0.25 mg Oral Daily  . furosemide  20 mg Oral Daily  . heparin  5,000 Units Subcutaneous 3 times per day  . levothyroxine  50 mcg Oral QAC breakfast  . QUEtiapine  25 mg Oral TID  . sodium chloride  1,000 mL Intravenous Once  . tamsulosin  0.4 mg Oral QHS     acetaminophen, nitroGLYCERIN, ondansetron (ZOFRAN) IV, zolpidem  Prior to Admission medications   Medication Sig Start Date End Date Taking? Authorizing Provider  aspirin 81 MG tablet Take 81 mg by mouth daily.   Yes Historical Provider, MD  atorvastatin (LIPITOR) 40 MG tablet Take 20 mg by mouth daily.   Yes Historical Provider, MD  citalopram (CELEXA) 40 MG tablet Take 40 mg by mouth daily.   Yes Historical Provider, MD  clopidogrel (PLAVIX) 75 MG tablet Take 75 mg by mouth daily.   Yes Historical Provider, MD  digoxin (LANOXIN) 0.25 MG tablet Take 0.25 mg by mouth daily.   Yes Historical Provider, MD  furosemide (LASIX) 20  MG tablet Take 20 mg by mouth as needed.   Yes Historical Provider, MD  levothyroxine (SYNTHROID, LEVOTHROID) 50 MCG tablet Take 50 mcg by mouth daily before breakfast.   Yes Historical Provider, MD  Melatonin 5 MG CAPS Take by mouth.   Yes Historical Provider, MD  Multiple Vitamins-Minerals (CENTRUM SILVER PO) Take by mouth.   Yes Historical Provider, MD  QUEtiapine (SEROQUEL) 25 MG tablet Take 25 mg by mouth 3 times/day as needed-between meals & bedtime.   Yes Historical Provider, MD  tamsulosin (FLOMAX) 0.4 MG CAPS capsule Take 0.4 mg by mouth Nightly.   Yes Historical Provider, MD     Social History   Social History  . Marital Status: Widowed    Spouse Name:  N/A  . Number of Children: N/A  . Years of Education: N/A   Occupational History  . Not on file.   Social History Main Topics  . Smoking status: Former Smoker    Types: Cigars  . Smokeless tobacco: Not on file  . Alcohol Use: No  . Drug Use: Not on file  . Sexual Activity: Not on file   Other Topics Concern  . Not on file   Social History Narrative    No family status information on file.   No family history on file.   ROS:  Full 14 point review of systems complete and found to be negative unless listed above.  Physical Exam: Blood pressure 111/69, pulse 77, temperature 99.6 F (37.6 C), temperature source Oral, resp. rate 18, height 5\' 8"  (1.727 m), weight 173 lb 4.5 oz (78.6 kg), SpO2 95 %.  General: Well developed, well nourished, male in no acute distress Head: Eyes PERRLA, No xanthomas. Normocephalic and atraumatic, oropharynx without edema or exudate.  Lungs: Resp regular and unlabored, CTA. Heart: RRR no s3, s4. Systolic murmurs. Neck: No carotid bruits. No lymphadenopathy.  No JVD. Abdomen: Bowel sounds present, abdomen soft and non-tender without masses or hernias noted. Msk:  No spine or cva tenderness. No weakness, no joint deformities or effusions. Extremities: No clubbing, cyanosis or edema. DP/PT/Radials 2+ and equal bilaterally. Neuro: . No focal deficits noted. Psych:  Dementia appropriately Skin: No rashes or lesions noted.  Labs:   Lab Results  Component Value Date   WBC 6.4 01/15/2015   HGB 12.2* 01/15/2015   HCT 37.1* 01/15/2015   MCV 92.5 01/15/2015   PLT 146* 01/15/2015    Recent Labs  01/14/15 2223  INR 1.27    Recent Labs Lab 01/14/15 1520  01/15/15 0221  NA 140  --  133*  K 4.2  --  3.9  CL 107  --  104  CO2 25  --  22  BUN 18  --  13  CREATININE 1.07  < > 0.94  CALCIUM 9.1  --  8.9  PROT 7.3  --   --   BILITOT 0.8  --   --   ALKPHOS 129*  --   --   ALT 19  --   --   AST 28  --   --   GLUCOSE 92  --  80  ALBUMIN 3.6   --   --   < > = values in this interval not displayed. No results found for: MG  Recent Labs  01/14/15 1520 01/14/15 2223 01/15/15 0221  TROPONINI 0.04* 0.03 0.03   No results for input(s): TROPIPOC in the last 72 hours. No results found for: PROBNP Lab Results  Component Value Date  CHOL 113 01/15/2015   HDL 36* 01/15/2015   LDLCALC 66 01/15/2015   TRIG 55 01/15/2015   No results found for: DDIMER No results found for: LIPASE, AMYLASE TSH  Date/Time Value Ref Range Status  01/14/2015 10:23 PM 4.233 0.350 - 4.500 uIU/mL Final     Echo: Pending  ECG:   Vent. rate 78 BPM PR interval 282 ms QRS duration 170 ms QT/QTc 446/508 ms P-R-T axes 184 29 199  Radiology:  Dg Chest 2 View  01/14/2015  CLINICAL DATA:  Altered mental status. History of colon carcinoma. Hypertension. EXAM: CHEST  2 VIEW COMPARISON:  None. FINDINGS: There is cardiomegaly with small pleural effusions bilaterally. No edema or consolidation. Pacemaker lead is attached to the right ventricle. No pneumothorax. No adenopathy. There is slight pulmonary venous hypertension. There is atherosclerotic calcification in the aorta. There is degenerative change in the thoracic spine. IMPRESSION: Evidence of pulmonary vascular congestion. Small pleural effusions bilaterally. No frank edema or consolidation. Electronically Signed   By: Lowella Grip III M.D.   On: 01/14/2015 16:12   Dg Ankle Complete Left  01/14/2015  CLINICAL DATA:  Pain.  No history of recent trauma EXAM: LEFT ANKLE COMPLETE - 3+ VIEW COMPARISON:  None. FINDINGS: Frontal, oblique and lateral views were obtained. There is no demonstrable fracture or joint effusion. The ankle mortise appears intact. There are small spurs arising from the posterior inferior calcaneus. There is narrowing in the medial aspect of the joint. There are multiple foci of arterial vascular calcification. IMPRESSION: Osteoarthritic change. Small calcaneal spurs. Extensive arterial  vascular calcification. No fracture. Ankle mortise appears intact. Electronically Signed   By: Lowella Grip III M.D.   On: 01/14/2015 16:13   Ct Head Wo Contrast  01/14/2015  CLINICAL DATA:  81 year old diabetic hypertensive male with weakness and lower extremity pain. Initial encounter. EXAM: CT HEAD WITHOUT CONTRAST TECHNIQUE: Contiguous axial images were obtained from the base of the skull through the vertex without intravenous contrast. COMPARISON:  None. FINDINGS: No skull fracture or intracranial hemorrhage. Right thalamic infarcts of indeterminate age, possibly remote. Remote left basal ganglia infarcts. Small vessel disease type changes without CT evidence of large acute infarct. Global atrophy without hydrocephalus. No intracranial mass lesion noted on this unenhanced exam. Vascular calcifications. IMPRESSION: Right thalamic infarcts of indeterminate age, possibly remote. Remote left basal ganglia infarcts. Small vessel disease type changes without CT evidence of large acute infarct. Global atrophy without hydrocephalus. Electronically Signed   By: Genia Del M.D.   On: 01/14/2015 16:10   US Venous Img Lower Unilateral Left  01/14/2015  CLINICAL DATA:  Left lower extremity pain for the past 2 hours. History of malignancy. Evaluate for DVT. EXAM: LEFT LOWER EXTREMITY VENOUS DOPPLER ULTRASOUND TECHNIQUE: Gray-scale sonography with graded compression, as well as color Doppler and duplex ultrasound were performed to evaluate the lower extremity deep venous systems from the level of the common femoral vein and including the common femoral, femoral, profunda femoral, popliteal and calf veins including the posterior tibial, peroneal and gastrocnemius veins when visible. The superficial great saphenous vein was also interrogated. Spectral Doppler was utilized to evaluate flow at rest and with distal augmentation maneuvers in the common femoral, femoral and popliteal veins. COMPARISON:  Left foot  radiographs - earlier same day FINDINGS: Contralateral Common Femoral Vein: Respiratory phasicity is normal and symmetric with the symptomatic side. No evidence of thrombus. Normal compressibility. Common Femoral Vein: No evidence of thrombus. Normal compressibility, respiratory phasicity and response to augmentation.  Saphenofemoral Junction: No evidence of thrombus. Normal compressibility and flow on color Doppler imaging. Profunda Femoral Vein: No evidence of thrombus. Normal compressibility and flow on color Doppler imaging. Femoral Vein: No evidence of thrombus. Normal compressibility, respiratory phasicity and response to augmentation. Popliteal Vein: No evidence of thrombus. Normal compressibility, respiratory phasicity and response to augmentation. Calf Veins: No evidence of thrombus. Normal compressibility and flow on color Doppler imaging. Superficial Great Saphenous Vein: No evidence of thrombus. Normal compressibility and flow on color Doppler imaging. Venous Reflux:  None. Other Findings:  None. IMPRESSION: No evidence of DVT within the left lower extremity. Electronically Signed   By: Sandi Mariscal M.D.   On: 01/14/2015 16:43    ASSESSMENT AND PLAN:    Principal Problem:   NSTEMI, initial episode of care [facility]) Active Problems:   HTN (hypertension)   Hypothyroidism   Diabetes mellitus without complication ([place])   Hyperlipidemia   Dementia with Lewy bodies  Plan: The patient with a  history of CABG (1985), CHF,  HTN, HL, Stroke, hypothyroidism, pacemaker placement and DM. [facility]). He was admitted for left leg pain. The initial troponin was 0.04. Following troponin 2 negative. EKG with left bundle branch block, no previous EKG to compare. The patient has a severe dementia and unable to provide specific information. Currently denies any complaints. Continue current home regimen. Requested records from primary cardiologist. Likely no  further cardiac evaluation at this point. Follow-up with Dr. Fayette Pho. M.D. to decide further. Consider PT/OT evaluation for deconditioning.   SignedLeanor Kail, PA 01/15/2015, 8:08 AM   Co-Sign MD  I have seen and examined the patient along with Bhagat,Bhavinkumar, PA.  I have reviewed the chart, notes and new data.  I agree with PA's note.  Key new complaints: chest symptoms poorly defined and atypical. Multiple serious non-cardiac problems. Daughter is most concerned about left leg repeatedly "giving out" as a possible neuro problem. Key examination changes: no arrhythmia or overt CHF on exam. Loud murmur of aortic sclerosis/stenosis. Bilateral carotid bruits, L>R. Key new findings / data: low risk ECG and troponin levels; reportedly pacemaker check yesterday was normal, looking for the report. 2011 carotid US scan showed 60-79% RICA stenosis, 15-17% LICA stenosis.  PLAN: No plan for additional coronary workup during this hospital stay. Despite "normal" digoxin level, his telemetry shows a regular non-paced ventricular rhythm on a background of atrial fibrillation, consistent with accelerated junctional rhythm or AIVR. May be a sign of digoxin toxicity. Reduce digoxin dose to 0.125 mg daily. Echo pending, but echo report from 2012 showed AV sclerosis, no stenosis, so unlikely to have meaningful AS now. Check bilateral carotid Dopplers.  Sanda Klein, MD, [place] [phone] 01/15/2015, 8:58 AM   Correction to the above note: The diagnosis of dementia with Lewy bodies was entered in error Although the patient has dementia, I am not aware of any diagnostic testing that supports the diagnosis of Lewy body dementia.  Sanda Klein, MD, [facility] [phone] office [phone] pager

## 2015-01-15 NOTE — Progress Notes (Signed)
UR Completed Kushal Saunders Graves-Bigelow, RN,BSN [phone]  

## 2015-01-15 NOTE — NC FL2 (Signed)
[place] LEVEL OF CARE SCREENING TOOL     IDENTIFICATION  Patient Name: Kenneth Parrish Birthdate: 12-Jan-1924 Sex: male Admission Date (Current Location): 01/14/2015  [facility] and [place] Number: Herbalist and Address:  [facility] [place], [place] [address], [place], [place] [address]      Provider Number: [id number]  Attending Physician Name and Address:  Jonetta Osgood, MD  Relative Name and Phone Number:       Current Level of Care: Hospital Recommended Level of Care: [place] Prior Approval Number:    Date Approved/Denied:   PASRR Number: [id number]  Discharge Plan: SNF    Current Diagnoses: Patient Active Problem List   Diagnosis Date Noted  . NSTEMI, initial episode of care ([place]) 01/14/2015  . NSTEMI (non-ST elevated myocardial infarction) ([place]) 01/14/2015  . HTN (hypertension) 01/14/2015  . Hypothyroidism 01/14/2015  . Diabetes mellitus without complication ([place]) 86/76/7209  . Hyperlipidemia 01/14/2015  . Dementia with Lewy bodies 01/14/2015    Orientation ACTIVITIES/SOCIAL BLADDER RESPIRATION    Self  Active Continent Normal  BEHAVIORAL SYMPTOMS/MOOD NEUROLOGICAL BOWEL NUTRITION STATUS  Other (Comment) (none)   Continent Diet (Heart Healthy Carb Modified)  PHYSICIAN VISITS COMMUNICATION OF NEEDS Height & Weight Skin    Verbally 5\' 8"  (172.7 cm) 173 lbs. Normal          AMBULATORY STATUS RESPIRATION    Assist extensive Normal      Personal Care Assistance Level of Assistance  Bathing, Dressing Bathing Assistance:  (Extensive assistance)   Dressing Assistance:  ([place])      Functional Limitations Info   (None)             SPECIAL CARE FACTORS FREQUENCY  PT (By licensed PT)     PT Frequency: Min 3x/week             Additional Factors Info   (NONE)               Current Medications (01/15/2015): Current Facility-Administered Medications  Medication  Dose Route Frequency Provider Last Rate Last Dose  . acetaminophen (TYLENOL) tablet 650 mg  650 mg Oral Q4H PRN Allyne Gee, MD      . aspirin EC tablet 81 mg  81 mg Oral Daily Allyne Gee, MD   81 mg at 01/15/15 0931  . atorvastatin (LIPITOR) tablet 20 mg  20 mg Oral Daily Allyne Gee, MD   20 mg at 01/15/15 0931  . citalopram (CELEXA) tablet 40 mg  40 mg Oral Daily Allyne Gee, MD   40 mg at 01/15/15 0931  . clopidogrel (PLAVIX) tablet 75 mg  75 mg Oral Daily Allyne Gee, MD   75 mg at 01/15/15 0931  . digoxin (LANOXIN) tablet 0.125 mg  0.125 mg Oral Daily Mihai Croitoru, MD   0.125 mg at 01/15/15 1125  . furosemide (LASIX) tablet 20 mg  20 mg Oral Daily Jonetta Osgood, MD   20 mg at 01/15/15 0931  . heparin injection 5,000 Units  5,000 Units Subcutaneous 3 times per day Allyne Gee, MD   5,000 Units at 01/15/15 0542  . levothyroxine (SYNTHROID, LEVOTHROID) tablet 50 mcg  50 mcg Oral QAC breakfast Allyne Gee, MD   50 mcg at 01/15/15 0932  . nitroGLYCERIN (NITROSTAT) SL tablet 0.4 mg  0.4 mg Sublingual Q5 Min x 3 PRN Allyne Gee, MD      . ondansetron [facility]) injection 4  mg  4 mg Intravenous Q6H PRN Allyne Gee, MD      . QUEtiapine (SEROQUEL) tablet 25 mg  25 mg Oral TID Allyne Gee, MD   25 mg at 01/15/15 0931  . sodium chloride 0.9 % bolus 1,000 mL  1,000 mL Intravenous Once Debby Freiberg, MD   Stopped at 01/14/15 1726  . tamsulosin (FLOMAX) capsule 0.4 mg  0.4 mg Oral QHS Allyne Gee, MD   0.4 mg at 01/14/15 2306  . zolpidem (AMBIEN) tablet 5 mg  5 mg Oral QHS PRN Allyne Gee, MD       Do not use this list as official medication orders. Please verify with discharge summary.  Discharge Medications:   Medication List    ASK your doctor about these medications        aspirin 81 MG tablet  Take 81 mg by mouth daily.     atorvastatin 40 MG tablet  Commonly known as:  LIPITOR  Take 20 mg by mouth daily.     CENTRUM SILVER PO  Take by mouth.      citalopram 40 MG tablet  Commonly known as:  CELEXA  Take 40 mg by mouth daily.     clopidogrel 75 MG tablet  Commonly known as:  PLAVIX  Take 75 mg by mouth daily.     digoxin 0.25 MG tablet  Commonly known as:  LANOXIN  Take 0.25 mg by mouth daily.     furosemide 20 MG tablet  Commonly known as:  LASIX  Take 20 mg by mouth as needed.     levothyroxine 50 MCG tablet  Commonly known as:  SYNTHROID, LEVOTHROID  Take 50 mcg by mouth daily before breakfast.     Melatonin 5 MG Caps  Take by mouth.     QUEtiapine 25 MG tablet  Commonly known as:  SEROQUEL  Take 25 mg by mouth 3 times/day as needed-between meals & bedtime.     tamsulosin 0.4 MG Caps capsule  Commonly known as:  FLOMAX  Take 0.4 mg by mouth Nightly.        Relevant Imaging Results:  Relevant Lab Results:  Recent Labs    Additional Information NKDA  [place], Deg[place]ory, LCSW

## 2015-01-15 NOTE — Progress Notes (Signed)
Pt is alert and oriented to person, currently resting in chair with daughter at bedside. No complain. i agree with previous nurse's assessment

## 2015-01-15 NOTE — Progress Notes (Signed)
PT Cancellation Note  Patient Details Name: Rihan Schueler MRN: [id number] DOB: 07-23-1923   Cancelled Treatment:    Reason Eval/Treat Not Completed: Patient at procedure or test/unavailable. Undergoing echo and then pacemaker rep needs to work with pt. Spoke with Aaron Edelman, SW. Daughter feels he is too weak to go home. Will see as soon as schedule permits.   Tonnya Garbett 01/15/2015, 12:05 PM  Pager [phone]

## 2015-01-15 NOTE — Progress Notes (Addendum)
PATIENT DETAILS Name: Kenneth Parrish Age: 81 y.o. Sex: male Date of Birth: 16-Sep-1923 Admit Date: 01/14/2015 Admitting Physician Albertine Patricia, MD PCP:No primary care provider on file.  Subjective: Pleasantly confused-moving all 4 extremities. Denies any pain in the left lower extremity.  Assessment/Plan: Principal Problem: ? Left foot pain-?weakness: Do not see any signs of left leg weakness on exam. Patient able to left and keep legs up on command. No joint swelling or erythema evident as well. No tenderness elicited on exam. Not sure what exactly led to his left leg giving out yesterday. Left lower extremity Dopplers negative for DVT. X-ray of the left ankle negative. CT head negative for acute abnormalities. Ambulate with physical therapy-if able to perform ambulation at baseline-suspect stable for discharge.  Active Problems: Minimally elevated initial troponin level: Subsequent troponin levels negative. Doubt any need for further workup. Cardiology recommending 2-D echocardiogram-pending.  Chronic systolic heart failure: Await repeat echocardiogram to assess EF. Clinically compensated-continue Lasix.  Paroxysmal atrial fibrillation: Suspect poor candidate for long-term anticoagulation given advanced dementia and fall risk-followed by cardiology as an outpatient-into the aspirin/Plavix, cardiology recommending to decrease the digoxin dosing. Follow.  History of carotid artery disease: Reviewed outpatient cardiology note from his primary cardiologist at [facility]-60-79% on the right-repeat carotid Doppler pending-continue aspirin/Plavix and Lipitor-doubt yesterday's event was related to symptomatic carotid artery disease-as patient was also complaining of pain in his foot.  History of CAD status post CABG: Continue with antiplatelet agents, statin. No chest pain or shortness of breath.85  BPH: Continue with Flomax  Hypothyroidism: Continue with  Synthroid  Dementia: Pleasantly confused-at baseline-continue Celexa and Seroquel.  Disposition: Remain inpatient  Antimicrobial agents  See below  Anti-infectives    None      DVT Prophylaxis: Prophylactic Heparin   Code Status: Full code  Family Communication Daughter at bedside  Procedures: None  CONSULTS:  cardiology  Time spent 30 minutes-Greater than 50% of this time was spent in counseling, explanation of diagnosis, planning of further management, and coordination of care.  MEDICATIONS: Scheduled Meds: . aspirin EC  81 mg Oral Daily  . atorvastatin  20 mg Oral Daily  . citalopram  40 mg Oral Daily  . clopidogrel  75 mg Oral Daily  . digoxin  0.125 mg Oral Daily  . furosemide  20 mg Oral Daily  . heparin  5,000 Units Subcutaneous 3 times per day  . levothyroxine  50 mcg Oral QAC breakfast  . QUEtiapine  25 mg Oral TID  . sodium chloride  1,000 mL Intravenous Once  . tamsulosin  0.4 mg Oral QHS   Continuous Infusions:  PRN Meds:.acetaminophen, nitroGLYCERIN, ondansetron (ZOFRAN) IV, zolpidem    PHYSICAL EXAM: Vital signs in last 24 hours: Filed Vitals:   01/14/15 1634 01/14/15 1915 01/14/15 2100 01/15/15 0500  BP:  118/72 144/76 111/69  Pulse:  73 77 77  Temp:   99.6 F (37.6 C) 99.6 F (37.6 C)  TempSrc:   Oral Oral  Resp: 20  22 18   Height:   5\' 8"  (1.727 m)   Weight:   78.6 kg (173 lb 4.5 oz)   SpO2: 93% 96% 97% 95%    Weight change:  Filed Weights   01/14/15 1251 01/14/15 2100  Weight: 80.287 kg (177 lb) 78.6 kg (173 lb 4.5 oz)   Body mass index is 26.35 kg/(m^2).   Gen Exam: Awake, pleasantly confused- with clear speech.  Neck: Supple, No JVD.   Chest: B/L Clear.   CVS: S1 S2 Regular Abdomen: soft, BS +, non tender, non distended.  Extremities: no edema, lower extremities warm to touch. Neurologic: Non Focal-5/5 all over Skin: No Rash.   Wounds: N/A.   Intake/Output from previous day:  Intake/Output Summary (Last 24  hours) at 01/15/15 1357 Last data filed at 01/15/15 1343  Gross per 24 hour  Intake    240 ml  Output    775 ml  Net   -535 ml     LAB RESULTS: CBC  Recent Labs Lab 01/14/15 1520 01/14/15 2223 01/15/15 0221  WBC 7.3 7.1 6.4  HGB 13.2 13.3 12.2*  HCT 39.8 40.2 37.1*  PLT 161 154 146*  MCV 92.8 93.7 92.5  MCH 30.8 31.0 30.4  MCHC 33.2 33.1 32.9  RDW 15.9* 15.8* 15.7*  LYMPHSABS 2.2  --   --   MONOABS 0.7  --   --   EOSABS 0.2  --   --   BASOSABS 0.1  --   --     Chemistries   Recent Labs Lab 01/14/15 1520 01/14/15 2223 01/15/15 0221  NA 140  --  133*  K 4.2  --  3.9  CL 107  --  104  CO2 25  --  22  GLUCOSE 92  --  80  BUN 18  --  13  CREATININE 1.07 0.91 0.94  CALCIUM 9.1  --  8.9    CBG:  Recent Labs Lab 01/14/15 1534 01/15/15 0747 01/15/15 1206  GLUCAP 84 82 105*    GFR Estimated Creatinine Clearance: 51.5 mL/min (by C-G formula based on Cr of 0.94).  Coagulation profile  Recent Labs Lab 01/14/15 2223  INR 1.27    Cardiac Enzymes  Recent Labs Lab 01/14/15 2223 01/15/15 0221 01/15/15 0943  TROPONINI 0.03 0.03 0.04*    Invalid input(s): POCBNP No results for input(s): DDIMER in the last 72 hours. No results for input(s): HGBA1C in the last 72 hours.  Recent Labs  01/15/15 0221  CHOL 113  HDL 36*  LDLCALC 66  TRIG 55  CHOLHDL 3.1    Recent Labs  01/14/15 2223  TSH 4.233   No results for input(s): VITAMINB12, FOLATE, FERRITIN, TIBC, IRON, RETICCTPCT in the last 72 hours. No results for input(s): LIPASE, AMYLASE in the last 72 hours.  Urine Studies No results for input(s): UHGB, CRYS in the last 72 hours.  Invalid input(s): UACOL, UAPR, USPG, UPH, UTP, UGL, UKET, UBIL, UNIT, UROB, ULEU, UEPI, UWBC, URBC, UBAC, CAST, UCOM, BILUA  MICROBIOLOGY: No results found for this or any previous visit (from the past 240 hour(s)).  RADIOLOGY STUDIES/RESULTS: Dg Chest 2 View  01/14/2015  CLINICAL DATA:  Altered mental  status. History of colon carcinoma. Hypertension. EXAM: CHEST  2 VIEW COMPARISON:  None. FINDINGS: There is cardiomegaly with small pleural effusions bilaterally. No edema or consolidation. Pacemaker lead is attached to the right ventricle. No pneumothorax. No adenopathy. There is slight pulmonary venous hypertension. There is atherosclerotic calcification in the aorta. There is degenerative change in the thoracic spine. IMPRESSION: Evidence of pulmonary vascular congestion. Small pleural effusions bilaterally. No frank edema or consolidation. Electronically Signed   By: Lowella Grip III M.D.   On: 01/14/2015 16:12   Dg Ankle Complete Left  01/14/2015  CLINICAL DATA:  Pain.  No history of recent trauma EXAM: LEFT ANKLE COMPLETE - 3+ VIEW COMPARISON:  None. FINDINGS: Frontal, oblique and lateral views were  obtained. There is no demonstrable fracture or joint effusion. The ankle mortise appears intact. There are small spurs arising from the posterior inferior calcaneus. There is narrowing in the medial aspect of the joint. There are multiple foci of arterial vascular calcification. IMPRESSION: Osteoarthritic change. Small calcaneal spurs. Extensive arterial vascular calcification. No fracture. Ankle mortise appears intact. Electronically Signed   By: Lowella Grip III M.D.   On: 01/14/2015 16:13   Ct Head Wo Contrast  01/14/2015  CLINICAL DATA:  81 year old diabetic hypertensive male with weakness and lower extremity pain. Initial encounter. EXAM: CT HEAD WITHOUT CONTRAST TECHNIQUE: Contiguous axial images were obtained from the base of the skull through the vertex without intravenous contrast. COMPARISON:  None. FINDINGS: No skull fracture or intracranial hemorrhage. Right thalamic infarcts of indeterminate age, possibly remote. Remote left basal ganglia infarcts. Small vessel disease type changes without CT evidence of large acute infarct. Global atrophy without hydrocephalus. No intracranial mass  lesion noted on this unenhanced exam. Vascular calcifications. IMPRESSION: Right thalamic infarcts of indeterminate age, possibly remote. Remote left basal ganglia infarcts. Small vessel disease type changes without CT evidence of large acute infarct. Global atrophy without hydrocephalus. Electronically Signed   By: Genia Del M.D.   On: 01/14/2015 16:10   US Venous Img Lower Unilateral Left  01/14/2015  CLINICAL DATA:  Left lower extremity pain for the past 2 hours. History of malignancy. Evaluate for DVT. EXAM: LEFT LOWER EXTREMITY VENOUS DOPPLER ULTRASOUND TECHNIQUE: Gray-scale sonography with graded compression, as well as color Doppler and duplex ultrasound were performed to evaluate the lower extremity deep venous systems from the level of the common femoral vein and including the common femoral, femoral, profunda femoral, popliteal and calf veins including the posterior tibial, peroneal and gastrocnemius veins when visible. The superficial great saphenous vein was also interrogated. Spectral Doppler was utilized to evaluate flow at rest and with distal augmentation maneuvers in the common femoral, femoral and popliteal veins. COMPARISON:  Left foot radiographs - earlier same day FINDINGS: Contralateral Common Femoral Vein: Respiratory phasicity is normal and symmetric with the symptomatic side. No evidence of thrombus. Normal compressibility. Common Femoral Vein: No evidence of thrombus. Normal compressibility, respiratory phasicity and response to augmentation. Saphenofemoral Junction: No evidence of thrombus. Normal compressibility and flow on color Doppler imaging. Profunda Femoral Vein: No evidence of thrombus. Normal compressibility and flow on color Doppler imaging. Femoral Vein: No evidence of thrombus. Normal compressibility, respiratory phasicity and response to augmentation. Popliteal Vein: No evidence of thrombus. Normal compressibility, respiratory phasicity and response to augmentation.  Calf Veins: No evidence of thrombus. Normal compressibility and flow on color Doppler imaging. Superficial Great Saphenous Vein: No evidence of thrombus. Normal compressibility and flow on color Doppler imaging. Venous Reflux:  None. Other Findings:  None. IMPRESSION: No evidence of DVT within the left lower extremity. Electronically Signed   By: Sandi Mariscal M.D.   On: 01/14/2015 16:43    Oren Binet, MD  [facility] Pager:[phone]  If 7PM-7AM, please contact night-coverage [email or web address] Password [email or web address] 01/15/2015, 1:57 PM   LOS: 1 day

## 2015-01-15 NOTE — Progress Notes (Signed)
  Echocardiogram 2D Echocardiogram has been performed.  Kenneth Parrish 01/15/2015, 1:03 PM

## 2015-01-15 NOTE — Clinical Social Work Note (Signed)
Clinical Social Work Assessment  Patient Details  Name: Kenneth Parrish MRN: [id number] Date of Birth: [birth year]  Date of referral:  01/15/15               Reason for consult:  Discharge Planning, Facility Placement                Permission sought to share information with:  Family Supports, Customer service manager Permission granted to share information::  No (Patient is very confused. Daughter at bedside handles his affairs.)  Name::     Public librarian::  SNFs  Relationship::     Contact Information:     Housing/Transportation Living arrangements for the past 2 months:  Single Family Home Source of Information:  Adult Children Patient Interpreter Needed:  None Criminal Activity/Legal Involvement Pertinent to Current Situation/Hospitalization:  No - Comment as needed Significant Relationships:  Adult Children Lives with:  Self, Other (Comment) (Has three in-home caregivers) Do you feel safe going back to the place where you live?  No Need for family participation in patient care:  Yes (Comment)  Care giving concerns:  Patient's daughter reports concerns about the patient returning home at discharge.    Social Worker assessment / plan:  CSW met with patient's daughter Kenneth Parrish to complete assessment. Per Kenneth Parrish, the patient lives at home alone with the assistance of 3 caregivers. The patient has caregiver support during the day but is alone at night. The patient has been managing well at home as he has been able to ambulate independently. The daughter states the patient cannot return home at discharge if he is unable to ambulate independently. CSW explained that the patient will likely not meet the 3 night inpatient stay requirement for Medicare SNF benefits and the daughter states the patient is unable to pay privately for placement. Kenneth Parrish is starting Medicaid application today in anticipation that this will be needed. CSW explained that we may be able to assist with letter of guarantee  placement if she in fact applies for the Medicaid. Daughter Kenneth Parrish was given information on how to apply for Medicaid here in [place]. CSW explained letter of guarantee SNF search/placement process. Per daughter the patient is also a veteran, but the [place] states the patient does not have a SNF benefit through the [place]. Per daughter the patient receives 1788/mon in aid and attendance from the [place] and about 1800/mon in [place]. CSW will follow up with bed offers.  Employment status:  Retired Forensic scientist:  Information systems manager, [employer] Benefit PT Recommendations:  [place] / Referral to community resources:  [place]  Patient/Family's Response to care:  Patient's daughter expresses that she has been very impressed with the care the patient has received but is frustrated by the placement process since the patient cannot utilize his Medicare.  Patient/Family's Understanding of and Emotional Response to Diagnosis, Current Treatment, and Prognosis:  Patient's daughter appears to have a good understanding of the patient's reason for admission and diagnosis. She understands what the patient will need at discharge.   Emotional Assessment Appearance:  Appears stated age Attitude/Demeanor/Rapport:  Unable to Assess Affect (typically observed):  Unable to Assess Orientation:  Oriented to Self Alcohol / Substance use:  Never Used Psych involvement (Current and /or in the community):  No (Comment)  Discharge Needs  Concerns to be addressed:  Discharge Planning Concerns Readmission within the last 30 days:  No Current discharge risk:  Chronically ill, Physical Impairment, Cognitively Impaired, Lives alone Barriers to Discharge:  Continued Medical Work up   [employer] MSW, [place], [place], [phone]

## 2015-01-15 NOTE — Clinical Social Work Placement (Signed)
   CLINICAL SOCIAL WORK PLACEMENT  NOTE  Date:  01/15/2015  Patient Details  Name: Kenneth Parrish MRN: [id number] Date of Birth: 1924/01/24  Clinical Social Work is seeking post-discharge placement for this patient at the [place] level of care (*CSW will initial, date and re-position this form in  chart as items are completed):  Yes   Patient/family provided with [place] Work Department's list of facilities offering this level of care within the geographic area requested by the patient (or if unable, by the patient's family).  Yes   Patient/family informed of their freedom to choose among providers that offer the needed level of care, that participate in Medicare, Medicaid or managed care program needed by the patient, have an available bed and are willing to accept the patient.  Yes   Patient/family informed of [place]'s ownership interest in [facility] and [facility], as well as of the fact that they are under no obligation to receive care at these facilities.  PASRR submitted to EDS on 01/15/15     PASRR number received on 01/15/15     Existing PASRR number confirmed on       FL2 transmitted to all facilities in geographic area requested by pt/family on 01/15/15     FL2 transmitted to all facilities within larger geographic area on       Patient informed that his/her managed care company has contracts with or will negotiate with certain facilities, including the following:            Patient/family informed of bed offers received.  Patient chooses bed at       Physician recommends and patient chooses bed at      Patient to be transferred to   on  .  Patient to be transferred to facility by       Patient family notified on   of transfer.  Name of family member notified:        PHYSICIAN       Additional Comment:    _______________________________________________ Liz Beach MSW, [place], [place], [phone]

## 2015-01-15 NOTE — Evaluation (Signed)
Physical Therapy Evaluation Patient Details Name: Kenneth Parrish MRN: [id number] DOB: 05/18/1923 Today's Date: 01/15/2015   History of Present Illness  Kenneth Parrish is a 81 y.o. male with history of dementia DM II HTN presents with an abnormal troponin. Patients daughter statrs that today the patient was at home and started to complain of having pain in his foot. Patients caregiver today noted that some thing was wrong when his caregiver was giving him a shower. Patients leg apparently gave oout and he states to complain of having left side pain. Patients daughter is not sure if he was having chest pain. Patient was also noted to have a low blood pressure.   Clinical Impression  Pt admitted with above diagnosis. Pt currently with functional limitations due to the deficits listed below (see PT Problem List).  Pt will benefit from skilled PT to increase their independence and safety with mobility to allow discharge to the venue listed below.  Although pt demonstrated LE strength during MMTs, he had 2 episodes of L LE buckeling requiring MOD A to maintain balance while ambulating 30' with RW.  Pt also had episode of inability to progress R LE forward while still pushing RW.  Prior to admission pt was able to ambulate at home with no assistive device.  Pt with significant change in functional status and recommend SNF.  He does not have 24 hour S available at home.    Follow Up Recommendations SNF;Supervision/Assistance - 24 hour    Equipment Recommendations  None recommended by PT    Recommendations for Other Services       Precautions / Restrictions        Mobility  Bed Mobility Overal bed mobility: Needs Assistance Bed Mobility: Supine to Sit     Supine to sit: Supervision     General bed mobility comments: use of rail  Transfers Overall transfer level: Needs assistance   Transfers: Sit to/from Stand Sit to Stand: Min assist         General transfer comment: A to power up and  cues for hand placement  Ambulation/Gait Ambulation/Gait assistance: Min assist;Mod assist Ambulation Distance (Feet): 30 Feet Assistive device: Rolling walker (2 wheeled) Gait Pattern/deviations: Decreased step length - right;Decreased step length - left Gait velocity: decreased   General Gait Details: Pt ambulated very slowly and with MIN A with pt frustrated by PT using gait belt. During second half of gait, pt's L LE buckeled twice requiring MOD A for postioning and for regaining balance.  Pt also had episode after buckeling where he could not progress R LE forward while continuing to push RW forward and needing MOD A to re-position.  Then pt able to ambulate last 5 feet wtih MIN A with no buckeling.  Stairs            Wheelchair Mobility    Modified Rankin (Stroke Patients Only)       Balance Overall balance assessment: Needs assistance Sitting-balance support: Feet supported Sitting balance-Leahy Scale: Fair     Standing balance support: Bilateral upper extremity supported Standing balance-Leahy Scale: Poor Standing balance comment: L LE buckeled twice during gait                             Pertinent Vitals/Pain Pain Assessment: No/denies pain    Home Living Family/patient expects to be discharged to:: Private residence Living Arrangements: Alone Available Help at Discharge: Personal care attendant;Available PRN/intermittently (Caregivers about 12 hrs/day  during the day with a little bit less on the weekend) Type of Home: [place]: One level Home Equipment: Grab bars - tub/shower;Grab bars - toilet;Walker - 2 wheels;Bedside commode;Shower seat;Wheelchair - manual;Cane - single point      Prior Function Level of Independence: Independent         Comments: normally ambulates with NO AD and no physical assistance     Hand Dominance        Extremity/Trunk Assessment               Lower Extremity Assessment: RLE  deficits/detail;LLE deficits/detail RLE Deficits / Details: strength grossly 4+/5 when tested with MMT LLE Deficits / Details: strength gorssly 4+/5 when tested with MMT, but 2 episodes of L LE buckeling during gait     Communication      Cognition Arousal/Alertness: Awake/alert Behavior During Therapy: Flat affect Overall Cognitive Status: History of cognitive impairments - at baseline       Memory: Decreased short-term memory              General Comments General comments (skin integrity, edema, etc.): Daughter present.  Pt returned supine at end of session and BP 101/66    Exercises        Assessment/Plan    PT Assessment Patient needs continued PT services  PT Diagnosis Difficulty walking   PT Problem List Decreased strength;Decreased activity tolerance;Decreased balance;Decreased mobility  PT Treatment Interventions Gait training;Functional mobility training;Therapeutic activities;Therapeutic exercise;DME instruction   PT Goals (Current goals can be found in the Care Plan section) Acute Rehab PT Goals Patient Stated Goal: Daughter wants to know why L leg keeps buckeling PT Goal Formulation: With family Time For Goal Achievement: 01/29/15 Potential to Achieve Goals: Fair    Frequency Min 3X/week   Barriers to discharge        Co-evaluation               End of Session Equipment Utilized During Treatment: Gait belt Activity Tolerance: Patient limited by fatigue Patient left: in bed;with call bell/phone within reach;with bed alarm set;with family/visitor present Nurse Communication:  (Spoke to PA student who was to relay gait to Dr. Darnell Level.)         Time: 5364-6803 PT Time Calculation (min) (ACUTE ONLY): 29 min   Charges:   PT Evaluation $Initial PT Evaluation Tier I: 1 Procedure PT Treatments $Gait Training: 8-22 mins   PT G Codes:        Jenna Routzahn LUBECK 01/15/2015, 2:36 PM

## 2015-01-16 ENCOUNTER — Inpatient Hospital Stay (HOSPITAL_COMMUNITY): Payer: Medicare Other

## 2015-01-16 DIAGNOSIS — I272 Other secondary pulmonary hypertension: Secondary | ICD-10-CM

## 2015-01-16 DIAGNOSIS — I739 Peripheral vascular disease, unspecified: Secondary | ICD-10-CM

## 2015-01-16 DIAGNOSIS — I779 Disorder of arteries and arterioles, unspecified: Secondary | ICD-10-CM

## 2015-01-16 DIAGNOSIS — E785 Hyperlipidemia, unspecified: Secondary | ICD-10-CM

## 2015-01-16 DIAGNOSIS — I482 Chronic atrial fibrillation, unspecified: Secondary | ICD-10-CM

## 2015-01-16 DIAGNOSIS — Z950 Presence of cardiac pacemaker: Secondary | ICD-10-CM

## 2015-01-16 DIAGNOSIS — Z515 Encounter for palliative care: Secondary | ICD-10-CM

## 2015-01-16 DIAGNOSIS — I5022 Chronic systolic (congestive) heart failure: Secondary | ICD-10-CM

## 2015-01-16 MED ORDER — DIGOXIN 250 MCG PO TABS: 0.1250 mg | ORAL_TABLET | Freq: Every day | ORAL | Status: AC

## 2015-01-16 MED ORDER — CITALOPRAM HYDROBROMIDE 20 MG PO TABS
20.0000 mg | ORAL_TABLET | Freq: Every day | ORAL | Status: DC
  Administered 2015-01-16: 20 mg via ORAL
  Filled 2015-01-16: qty 1

## 2015-01-16 MED ORDER — QUETIAPINE FUMARATE 25 MG PO TABS: 25.0000 mg | ORAL_TABLET | Freq: Every day | ORAL | Status: DC

## 2015-01-16 NOTE — Progress Notes (Signed)
*  PRELIMINARY RESULTS* Vascular Ultrasound Carotid Duplex (Doppler) has been completed.  Preliminary findings:  Right = 80-99% ICA stenosis, based on systolic velocity and Ratio. 60-79% stenosis, based on diastolic velocity. (201/00 cm/s) Left = 40-59% ICA stenosis.  Atypical flow right vertebral. Antegrade flow left vertebral.      Landry Mellow, RDMS, RVT  01/16/2015, 3:25 PM

## 2015-01-16 NOTE — Consult Note (Signed)
Consultation Note Date: 01/16/2015   Kenneth Parrish Name: Kenneth Parrish  DOB: [birth year]  MRN: [id number]  Age / Sex: 81 y.o., male  PCP: No primary care provider on file. Referring Physician: Jonetta Osgood, MD  Reason for Consultation: Establishing goals of care    Clinical Assessment/Narrative: Kenneth Parrish is a 81 year old woman with a past medical history significant for coronary artery disease status post CABG in the 1980s, history of permanent pacemaker placement. Shunt has history of hypertension hyperlipidemia history of stroke and hypothyroidism. Kenneth Parrish lives at home in [facility] with 3 caregivers. Kenneth Parrish was brought into the emergency department for suspected chest discomfort as well as possible left foot pain. Kenneth Parrish has history of dementia. Was noted that initial workup in the emergency department showed mildly elevated troponins. Kenneth Parrish was admitted to hospitalist service for further management.  During the course of this hospitalization, Kenneth Parrish has been seen and evaluated by cardiology. Surface echocardiogram reveals ejection fraction 15-20 percent, severe global hypokinesis of the entire myocardium. Kenneth Parrish has borderline low blood pressures. Consideration for a more palliative based approach to care was recommended by cardiology services.  Kenneth Parrish is resting comfortably in his bed. Chart reviewed. The Kenneth Parrish is actually to be discharged shortly to [facility] and rehabilitation to attempt some physical therapy to improve some functional status. Kenneth Parrish is very keen on simply going back home but is accepting of the need to go over 2 attempts some rehabilitation.  Discussed extensively with the Kenneth daughter outside the room. Introduced scope of palliative care. There is no established healthcare power of attorney agent. Kenneth Parrish has 4 adult children. Kenneth Parrish states that she has been  primarily responsible for the Kenneth medical decisions as well as his care. She has also supported him financially. She has an appointment pending with an elder care attorney later this month so that she may proceed with easily obtaining healthcare power of attorney information. Discussed about the Kenneth medical conditions, his hospice eligibility simply based on the nature of his end-stage heart disease.  The Kenneth daughter has been provided with information about palliative care services here [facility]. Shunt appropriate to be discharged to [facility] and rehabilitation facility. Daughter wishes to tinea to think about initial preliminary discussions undertaken on DO NOT RESUSCITATE/DO NOT INTUBATE, a more comfort based approach to his care focusing on symptom management and enhancing quality of life referable he even considering discharge home with hospice support after his rehabilitation attempt. Discussed with Kenneth daughter about the carotid ultrasound results, the Kenneth low EF and that the Kenneth Parrish is likely not a surgical candidate.   All of her questions answered to the best of my ability. Thank you for the consult.  Contacts/Participants in Discussion: Kenneth Parrish gerlin  Primary Decision Maker: Kenneth daughter Relationship to Kenneth Parrish daughter HCPOA: no  There is no established healthcare power of attorney agent. Kenneth Parrish has 4 children. The only contact number listed on the chart is Kenneth Parrish who is currently present at the bedside, she lives in [place], [phone]  SUMMARY OF RECOMMENDATIONS:  [place] for D/c to facility today, full code, rehab attempt.  Palliative will be happy to assist if the Kenneth Parrish has future hospitalizations.    Code Status/Advance Care Planning: Full code    Code Status Orders        Start     Ordered   01/14/15 2149  Full code   Continuous     01/14/15 2149    Advance Directive  Documentation         Most Recent Value   Type of Advance Directive  Healthcare Power of Attorney, Living will   Pre-existing out of facility DNR order (yellow form or pink MOST form)     "MOST" Form in Place?        Other Directives:Other  Symptom Management:    continue current treatment.   Palliative Prophylaxis:   Delirium Protocol  Additional Recommendations (Limitations, Scope, Preferences):  continue discussions,recommend palliative care consultation at the rehab facility  Psycho-social/Spiritual:  Support System: [place] Desire for further Chaplaincy support:no Additional Recommendations: Education on Hospice  Prognosis: < 6 months  Discharge Planning: SNF rehab   Chief Complaint/ Primary Diagnoses: Present on Admission:  . NSTEMI, initial episode of care [facility]) . NSTEMI (non-ST elevated myocardial infarction) ([place])  I have reviewed the medical record, interviewed the Kenneth Parrish and family, and examined the Kenneth Parrish. The following aspects are pertinent.  Past Medical History  Diagnosis Date  . Dementia   . Colon cancer ([place])   . Diabetes mellitus without complication ([place])   . High cholesterol   . Hypertension   . Thyroid disease   . Coronary artery disease   . Presence of permanent cardiac pacemaker   . Stroke ([place])   . CHF (congestive heart failure) ([place])   . Depression   . Anxiety   . Hypothyroidism    Social History   Social History  . Marital Status: Widowed    Spouse Name: N/A  . Number of Children: N/A  . Years of Education: N/A   Social History Main Topics  . Smoking status: Former Smoker    Types: Cigars  . Smokeless tobacco: None  . Alcohol Use: No  . Drug Use: None  . Sexual Activity: Not Asked   Other Topics Concern  . None   Social History Narrative   History reviewed. No pertinent family history. Scheduled Meds: . aspirin EC  81 mg Oral Daily  . atorvastatin  20 mg Oral Daily  . citalopram  20 mg Oral Daily  . clopidogrel  75 mg Oral Daily  .  digoxin  0.125 mg Oral Daily  . furosemide  20 mg Oral Daily  . heparin  5,000 Units Subcutaneous 3 times per day  . levothyroxine  50 mcg Oral QAC breakfast  . [START ON 01/17/2015] QUEtiapine  25 mg Oral QHS  . sodium chloride  1,000 mL Intravenous Once  . tamsulosin  0.4 mg Oral QHS   Continuous Infusions:  PRN Meds:.acetaminophen, nitroGLYCERIN, ondansetron (ZOFRAN) IV, zolpidem Medications Prior to Admission:  Prior to Admission medications   Medication Sig Start Date End Date Taking? Authorizing Provider  aspirin 81 MG tablet Take 81 mg by mouth daily.   Yes Historical Provider, MD  atorvastatin (LIPITOR) 40 MG tablet Take 20 mg by mouth daily.   Yes Historical Provider, MD  B Complex-C (B-COMPLEX WITH VITAMIN C) tablet Take 1 tablet by mouth daily.   Yes Historical Provider, MD  citalopram (CELEXA) 40 MG tablet Take 20 mg by mouth daily.    Yes Historical Provider, MD  clopidogrel (PLAVIX) 75 MG tablet Take 75 mg by mouth daily.   Yes Historical Provider, MD  furosemide (LASIX) 20 MG tablet Take 20 mg by mouth daily as needed for fluid.    Yes Historical Provider, MD  galantamine (RAZADYNE) 8 MG tablet Take 8 mg by mouth 2 (two) times daily.   Yes Historical Provider, MD  levothyroxine (SYNTHROID, LEVOTHROID) 50  MCG tablet Take 50 mcg by mouth daily before breakfast.   Yes Historical Provider, MD  memantine (NAMENDA) 10 MG tablet Take 10 mg by mouth 2 (two) times daily.   Yes Historical Provider, MD  Multiple Vitamin (MULTIVITAMIN WITH MINERALS) TABS tablet Take 1 tablet by mouth daily.   Yes Historical Provider, MD  QUEtiapine (SEROQUEL) 25 MG tablet Take 25 mg by mouth at bedtime.    Yes Historical Provider, MD  tamsulosin (FLOMAX) 0.4 MG CAPS capsule Take 0.4 mg by mouth Nightly.   Yes Historical Provider, MD  digoxin (LANOXIN) 0.25 MG tablet Take 0.5 tablets (0.125 mg total) by mouth daily. 01/16/15   Shanker Kristeen Mans, MD  Melatonin 5 MG CAPS Take 5 mg by mouth at bedtime.      Historical Provider, MD   No Known Allergies CBC:    Component Value Date/Time   WBC 6.4 01/15/2015 0221   HGB 12.2* 01/15/2015 0221   HCT 37.1* 01/15/2015 0221   PLT 146* 01/15/2015 0221   MCV 92.5 01/15/2015 0221   NEUTROABS 4.2 01/14/2015 1520   LYMPHSABS 2.2 01/14/2015 1520   MONOABS 0.7 01/14/2015 1520   EOSABS 0.2 01/14/2015 1520   BASOSABS 0.1 01/14/2015 1520   Comprehensive Metabolic Panel:    Component Value Date/Time   NA 133* 01/15/2015 0221   K 3.9 01/15/2015 0221   CL 104 01/15/2015 0221   CO2 22 01/15/2015 0221   BUN 13 01/15/2015 0221   CREATININE 0.94 01/15/2015 0221   GLUCOSE 80 01/15/2015 0221   CALCIUM 8.9 01/15/2015 0221   AST 28 01/14/2015 1520   ALT 19 01/14/2015 1520   ALKPHOS 129* 01/14/2015 1520   BILITOT 0.8 01/14/2015 1520   PROT 7.3 01/14/2015 1520   ALBUMIN 3.6 01/14/2015 1520    Review of Systems Completed Physical Exam Frail weak appearing elderly gentleman Awake, pleasantly confused Clear diminished at bases S1-S2 Abdomen soft  Vital Signs: BP 111/52 mmHg  Pulse 73  Temp(Src) 98 F (36.7 C) (Oral)  Resp 18  Ht 5\' 8"  (1.727 m)  Wt 78.019 kg (172 lb)  BMI 26.16 kg/m2  SpO2 96% SpO2: Last BM Date: 01/15/15  O2 Device:SpO2: 96 % O2 Flow Rate: .  Intake/output summary:  Intake/Output Summary (Last 24 hours) at 01/16/15 1623 Last data filed at 01/15/15 1854  Gross per 24 hour  Intake    480 ml  Output    300 ml  Net    180 ml   LBM:  BMP Latest Ref Rng 01/15/2015 01/14/2015 01/14/2015  Glucose 65 - 99 mg/dL 80 - 92  BUN 6 - 20 mg/dL 13 - 18  Creatinine 0.61 - 1.24 mg/dL 0.94 0.91 1.07  Sodium 135 - 145 mmol/L 133(L) - 140  Potassium 3.5 - 5.1 mmol/L 3.9 - 4.2  Chloride 101 - 111 mmol/L 104 - 107  CO2 22 - 32 mmol/L 22 - 25  Calcium 8.9 - 10.3 mg/dL 8.9 - 9.1    Baseline Weight: Weight: 80.287 kg (177 lb) Most recent weight: Weight: 78.019 kg (172 lb)     Palliative performance scale 30% Palliative Assessment/Data:    Flowsheet Rows        Most Recent Value   Intake Tab    Referral Department  Cardiology   Unit at Time of Referral  Cardiac/Telemetry Unit   Palliative Care Primary Diagnosis  Cardiac   Date Notified  01/16/15   Palliative Care Type  New Palliative care   Reason for referral  Clarify Goals of Care   Date of Admission  01/14/15   # of days IP prior to Palliative referral  2   Clinical Assessment    Psychosocial & Spiritual Assessment    Palliative Care Outcomes       Additional Data Reviewed: Recent Labs     01/14/15  1520  01/14/15  2223  01/15/15  0221  WBC  7.3  7.1  6.4  HGB  13.2  13.3  12.2*  PLT  161  154  146*  NA  140   --   133*  BUN  18   --   13  CREATININE  1.07  0.91  0.94    Time In: 1515 Time Out: 1615 Time Total: 60 Greater than 50%  of this time was spent counseling and coordinating care related to the above assessment and plan.  Signed by: Loistine Chance, MD [phone] Loistine Chance, MD  01/16/2015, 4:23 PM  Please contact Palliative Medicine Team phone at [phone] for questions and concerns.

## 2015-01-16 NOTE — Progress Notes (Addendum)
Patient Name: Kenneth Parrish Date of Encounter: 01/16/2015   SUBJECTIVE  Feels ok and weak. Denies CP, sob or palpation.   CURRENT MEDS . aspirin EC  81 mg Oral Daily  . atorvastatin  20 mg Oral Daily  . citalopram  20 mg Oral Daily  . clopidogrel  75 mg Oral Daily  . digoxin  0.125 mg Oral Daily  . furosemide  20 mg Oral Daily  . heparin  5,000 Units Subcutaneous 3 times per day  . levothyroxine  50 mcg Oral QAC breakfast  . QUEtiapine  25 mg Oral TID  . sodium chloride  1,000 mL Intravenous Once  . tamsulosin  0.4 mg Oral QHS    OBJECTIVE  Filed Vitals:   01/15/15 0500 01/15/15 1421 01/15/15 2032 01/16/15 0554  BP: 111/69 99/58 115/63 102/57  Pulse: 77 78 72 64  Temp: 99.6 F (37.6 C) 98.9 F (37.2 C) 98.5 F (36.9 C) 98.8 F (37.1 C)  TempSrc: Oral Oral Oral Oral  Resp: 18 20 18 18   Height:      Weight:    172 lb (78.019 kg)  SpO2: 95% 95% 96% 93%    Intake/Output Summary (Last 24 hours) at 01/16/15 0932 Last data filed at 01/15/15 1854  Gross per 24 hour  Intake    840 ml  Output    300 ml  Net    540 ml   Filed Weights   01/14/15 1251 01/14/15 2100 01/16/15 0554  Weight: 177 lb (80.287 kg) 173 lb 4.5 oz (78.6 kg) 172 lb (78.019 kg)    PHYSICAL EXAM  General: Pleasant, NAD. Neuro: Alert and oriented X 3. Moves all extremities spontaneously. Psych: Normal affect. HEENT:  Normal  Neck: Supple without JVD. Bilateral carotid bruit.  Lungs:  Resp regular and unlabored, CTA. Heart: RRR no s3, s4. 2-3/6 SEM. Abdomen: Soft, non-tender, non-distended, BS + x 4.  Extremities: No clubbing, cyanosis or edema. DP/PT/Radials 2+ and equal bilaterally.  Accessory Clinical Findings  CBC  Recent Labs  01/14/15 1520 01/14/15 2223 01/15/15 0221  WBC 7.3 7.1 6.4  NEUTROABS 4.2  --   --   HGB 13.2 13.3 12.2*  HCT 39.8 40.2 37.1*  MCV 92.8 93.7 92.5  PLT 161 154 497*   Basic Metabolic Panel  Recent Labs  01/14/15 1520 01/14/15 2223 01/15/15 0221    NA 140  --  133*  K 4.2  --  3.9  CL 107  --  104  CO2 25  --  22  GLUCOSE 92  --  80  BUN 18  --  13  CREATININE 1.07 0.91 0.94  CALCIUM 9.1  --  8.9   Liver Function Tests  Recent Labs  01/14/15 1520  AST 28  ALT 19  ALKPHOS 129*  BILITOT 0.8  PROT 7.3  ALBUMIN 3.6   Cardiac Enzymes  Recent Labs  01/14/15 2223 01/15/15 0221 01/15/15 0943  TROPONINI 0.03 0.03 0.04*   Fasting Lipid Panel  Recent Labs  01/15/15 0221  CHOL 113  HDL 36*  LDLCALC 66  TRIG 55  CHOLHDL 3.1   Thyroid Function Tests  Recent Labs  01/14/15 2223  TSH 4.233    TELE  Non paced V d rhythm  Radiology/Studies  Dg Chest 2 View  01/14/2015  CLINICAL DATA:  Altered mental status. History of colon carcinoma. Hypertension. EXAM: CHEST  2 VIEW COMPARISON:  None. FINDINGS: There is cardiomegaly with small pleural effusions bilaterally. No edema or consolidation. Pacemaker  lead is attached to the right ventricle. No pneumothorax. No adenopathy. There is slight pulmonary venous hypertension. There is atherosclerotic calcification in the aorta. There is degenerative change in the thoracic spine. IMPRESSION: Evidence of pulmonary vascular congestion. Small pleural effusions bilaterally. No frank edema or consolidation. Electronically Signed   By: Lowella Grip III M.D.   On: 01/14/2015 16:12   Dg Ankle Complete Left  01/14/2015  CLINICAL DATA:  Pain.  No history of recent trauma EXAM: LEFT ANKLE COMPLETE - 3+ VIEW COMPARISON:  None. FINDINGS: Frontal, oblique and lateral views were obtained. There is no demonstrable fracture or joint effusion. The ankle mortise appears intact. There are small spurs arising from the posterior inferior calcaneus. There is narrowing in the medial aspect of the joint. There are multiple foci of arterial vascular calcification. IMPRESSION: Osteoarthritic change. Small calcaneal spurs. Extensive arterial vascular calcification. No fracture. Ankle mortise appears  intact. Electronically Signed   By: Lowella Grip III M.D.   On: 01/14/2015 16:13   Ct Head Wo Contrast  01/14/2015  CLINICAL DATA:  81 year old diabetic hypertensive male with weakness and lower extremity pain. Initial encounter. EXAM: CT HEAD WITHOUT CONTRAST TECHNIQUE: Contiguous axial images were obtained from the base of the skull through the vertex without intravenous contrast. COMPARISON:  None. FINDINGS: No skull fracture or intracranial hemorrhage. Right thalamic infarcts of indeterminate age, possibly remote. Remote left basal ganglia infarcts. Small vessel disease type changes without CT evidence of large acute infarct. Global atrophy without hydrocephalus. No intracranial mass lesion noted on this unenhanced exam. Vascular calcifications. IMPRESSION: Right thalamic infarcts of indeterminate age, possibly remote. Remote left basal ganglia infarcts. Small vessel disease type changes without CT evidence of large acute infarct. Global atrophy without hydrocephalus. Electronically Signed   By: Genia Del M.D.   On: 01/14/2015 16:10   US Venous Img Lower Unilateral Left  01/14/2015  CLINICAL DATA:  Left lower extremity pain for the past 2 hours. History of malignancy. Evaluate for DVT. EXAM: LEFT LOWER EXTREMITY VENOUS DOPPLER ULTRASOUND TECHNIQUE: Gray-scale sonography with graded compression, as well as color Doppler and duplex ultrasound were performed to evaluate the lower extremity deep venous systems from the level of the common femoral vein and including the common femoral, femoral, profunda femoral, popliteal and calf veins including the posterior tibial, peroneal and gastrocnemius veins when visible. The superficial great saphenous vein was also interrogated. Spectral Doppler was utilized to evaluate flow at rest and with distal augmentation maneuvers in the common femoral, femoral and popliteal veins. COMPARISON:  Left foot radiographs - earlier same day FINDINGS: Contralateral Common  Femoral Vein: Respiratory phasicity is normal and symmetric with the symptomatic side. No evidence of thrombus. Normal compressibility. Common Femoral Vein: No evidence of thrombus. Normal compressibility, respiratory phasicity and response to augmentation. Saphenofemoral Junction: No evidence of thrombus. Normal compressibility and flow on color Doppler imaging. Profunda Femoral Vein: No evidence of thrombus. Normal compressibility and flow on color Doppler imaging. Femoral Vein: No evidence of thrombus. Normal compressibility, respiratory phasicity and response to augmentation. Popliteal Vein: No evidence of thrombus. Normal compressibility, respiratory phasicity and response to augmentation. Calf Veins: No evidence of thrombus. Normal compressibility and flow on color Doppler imaging. Superficial Great Saphenous Vein: No evidence of thrombus. Normal compressibility and flow on color Doppler imaging. Venous Reflux:  None. Other Findings:  None. IMPRESSION: No evidence of DVT within the left lower extremity. Electronically Signed   By: Sandi Mariscal M.D.   On: 01/14/2015 16:43  Echo 01/15/15 LV EF: 15% -  20%  ------------------------------------------------------------------- Indications:   Chest pain 786.51.  ------------------------------------------------------------------- History:  Risk factors: History of Colon cancer. NSTEMI. Hypertension. Diabetes mellitus. Dyslipidemia.  ------------------------------------------------------------------- Study Conclusions  - Left ventricle: The cavity size was moderately dilated. Wall thickness was normal. Systolic function was severely reduced. The estimated ejection fraction was in the range of 15% to 20%. Severe hypokinesis of the entire myocardium. - Left atrium: The atrium was mildly dilated. - Right ventricle: The cavity size was moderately dilated. Systolic function was moderately reduced. - Right atrium: The atrium was  moderately dilated. - Tricuspid valve: There was moderate regurgitation. - Pulmonary arteries: Systolic pressure was severely increased. PA peak pressure: 76 mm Hg (S).   ASSESSMENT AND PLAN The patient with a history of CABG (1985), CHF, HTN, HL, Stroke, hypothyroidism, chronic systolic CHF, pacemaker placement, PAF and DM who was admitted for left foot pain.   Principal Problem:   NSTEMI, initial episode of care [facility]) Active Problems:   NSTEMI (non-ST elevated myocardial infarction) ([place])   HTN (hypertension)   Hypothyroidism   Diabetes mellitus without complication ([place])   Hyperlipidemia   Dementia with Lewy bodies    Plan: Reduced digoxin dose to 0.125mg  yesterday. Echo showed LV Ef of 15-20%, Severe hypokinesis of the entire myocardium, mild dilated LA, moderate dilated RV with reduced function, moderate dilated RA, moderate tricuspid regar, PA peak pressure: 76 mm Hg (S). No AS on echo. Echo 2012 has ef of 30-35%. Peak of troponin was 0.04. Atypical chest pain. 2011 carotid US scan showed 60-79% RICA stenosis, 21-30% LICA stenosis. Pending repeat carotid US. Management per MD.  - Continue ASA, plavix, statin and low dose lasix.  Signed, Bhagat,Bhavinkumar PA-C   I have seen and examined the patient along with Bhagat,Bhavinkumar PA-C.  I have reviewed the chart, notes and new data.  I agree with PA's note.  Key new complaints: none. Family not here yet. Key examination changes: no overt hypervolemia. Dominant rhythm is accelerated junctional rhythm, less V pacing is seen. Key new findings / data: marked reduction in LVEF, carotid scan results pending  PLAN: May benefit from CRT upgrade, but would leave that decision to Dr. Elonda Husky who is his usual cardiologist and manages his device. His dementia is advanced and invasive procedures may not be the best solution or lead to significant improvement in QOL. Would avoid beta blockers as this would lead to more V pacing, which could  be deleterious. BP borderline low and he has history of orthostatic hypotension, but could try very low dose ACEi (lisinopril 2.5 mg daily). Would like to discuss that with his daughter before starting.  Sanda Klein, MD, [place] [phone] 01/16/2015, 10:31 AM   Correction to the above note: The diagnosis of dementia with Lewy bodies was entered in error Although the patient has dementia, I am not aware of any diagnostic testing that supports the diagnosis of Lewy body dementia.  Sanda Klein, MD, [facility] [phone] office [phone] pager

## 2015-01-16 NOTE — Progress Notes (Signed)
Daughter believes he has previously taken lisinopril and that it might have been stopped due to hypotension. Will not prescribe. Discussed his poor overall prognosis. In my opinion, he is appropriate for palliative care, even home hospice.  Sanda Klein, MD, [facility] [phone] office [phone] pager

## 2015-01-16 NOTE — Clinical Social Work Placement (Signed)
   CLINICAL SOCIAL WORK PLACEMENT  NOTE  Date:  01/16/2015  Patient Details  Name: Kenneth Parrish MRN: [id number] Date of Birth: [birth year]  Clinical Social Work is seeking post-discharge placement for this patient at the [place] level of care (*CSW will initial, date and re-position this form in  chart as items are completed):  Yes   Patient/family provided with [place] Work Department's list of facilities offering this level of care within the geographic area requested by the patient (or if unable, by the patient's family).  Yes   Patient/family informed of their freedom to choose among providers that offer the needed level of care, that participate in Medicare, Medicaid or managed care program needed by the patient, have an available bed and are willing to accept the patient.  Yes   Patient/family informed of [place]'s ownership interest in [facility] and [facility], as well as of the fact that they are under no obligation to receive care at these facilities.  PASRR submitted to EDS on 01/15/15     PASRR number received on 01/15/15     Existing PASRR number confirmed on       FL2 transmitted to all facilities in geographic area requested by pt/family on 01/15/15     FL2 transmitted to all facilities within larger geographic area on       Patient informed that his/her managed care company has contracts with or will negotiate with certain facilities, including the following:        Yes   Patient/family informed of bed offers received.  Patient chooses bed at [facility]     Physician recommends and patient chooses bed at      Patient to be transferred to [facility] on 01/16/15.  Patient to be transferred to facility by Ambulance     Patient family notified on 01/16/15 of transfer.  Name of family member notified:  [name]     PHYSICIAN       Additional Comment:  Per MD patient ready  for DC to [facility] RN, patient, patient's family [name]), and facility notified of DC. RN given number for report. DC packet on chart. Patient will not DC until doppler results are back. Unit RN will need to call for transport ([phone] before 5 and [phone] if after 5). These numbers were given to RN. CSW has made Tammy with facility aware that a palliative care consult may be needed for the patient at SNF. CSW signing off.   _______________________________________________ Liz Beach MSW, [place], [place], [phone]

## 2015-01-16 NOTE — Progress Notes (Signed)
Report called to Art Buff, RN at [facility] and [place]. Pt and family aware of prepared discharge.

## 2015-01-16 NOTE — Progress Notes (Signed)
Called by 3W to speak with daughter re: her father's d/c earlier today.  Pt's daughter, Webb Silversmith, very upset with the cleanliness/appearance of the facility, as well as the care her father receiving.  Explained that, unfortunately, pt had no other LOG bed offers or qualifying stay and there was nothing CSW could offer.  Pt cannot return home and cannot afford to private pay for SNF.  Pt is LTC Medicaid pending.  Daughter encouraged to speak with NH Administrator re: his concerns.  Daughter appreciative of CSW visit.

## 2015-01-16 NOTE — Care Management Note (Signed)
Case Management Note  Patient Details  Name: Emrys Mckamie MRN: [id number] Date of Birth: 20-Jun-1923  Subjective/Objective: Pt admitted for cp increased cardiac enzymes. Pt is from home with caregivers. Unable to return home safely.                    Action/Plan: CSW assisting with disposition needs. No needs identified for CM at this time.    Expected Discharge Date:                  Expected Discharge Plan:  Skilled Nursing Facility  In-House Referral:  Clinical Social Work  Discharge planning Services  CM Consult  Post Acute Care Choice:  NA Choice offered to:  NA  DME Arranged:  N/A DME Agency:  NA  HH Arranged:  NA HH Agency:  NA  Status of Service:  Completed, signed off  Medicare Important Message Given:    Date Medicare IM Given:    Medicare IM give by:    Date Additional Medicare IM Given:    Additional Medicare Important Message give by:     If discussed at [place] of Stay Meetings, dates discussed:    Additional Comments:  Bethena Roys, RN 01/16/2015, 9:43 AM

## 2015-01-16 NOTE — Discharge Summary (Addendum)
PATIENT DETAILS Name: Kenneth Parrish Age: 81 y.o. Sex: male Date of Birth: 1923-10-02 MRN: [id number]. Admitting Physician: Albertine Patricia, MD PCP:No primary care provider on file.  Admit Date: 01/14/2015 Discharge date: 01/16/2015  Recommendations for Outpatient Follow-up:  1. Repeat CBC and chemistries in 1 week  2. Obtain palliative care evaluation at SNF 3. If Family changes mind regarding carotid artery stenosis-suggest vascular surgery evaluation as outpatient  PRIMARY DISCHARGE DIAGNOSIS:  Active Problems:   HTN (hypertension)   Hypothyroidism   Diabetes mellitus without complication (HCC)   Hyperlipidemia   Dementia    Chronic atrial fibrillation (HCC)   Chronic systolic heart failure [facility])   Pacemaker      PAST MEDICAL HISTORY: Past Medical History  Diagnosis Date  . Dementia   . Colon cancer ([place])   . Diabetes mellitus without complication ([place])   . High cholesterol   . Hypertension   . Thyroid disease   . Coronary artery disease   . Presence of permanent cardiac pacemaker   . Stroke ([place])   . CHF (congestive heart failure) ([place])   . Depression   . Anxiety   . Hypothyroidism     DISCHARGE MEDICATIONS: Current Discharge Medication List    CONTINUE these medications which have CHANGED   Details  digoxin (LANOXIN) 0.25 MG tablet Take 0.5 tablets (0.125 mg total) by mouth daily.      CONTINUE these medications which have NOT CHANGED   Details  aspirin 81 MG tablet Take 81 mg by mouth daily.    atorvastatin (LIPITOR) 40 MG tablet Take 20 mg by mouth daily.    B Complex-C (B-COMPLEX WITH VITAMIN C) tablet Take 1 tablet by mouth daily.    citalopram (CELEXA) 40 MG tablet Take 20 mg by mouth daily.     clopidogrel (PLAVIX) 75 MG tablet Take 75 mg by mouth daily.    furosemide (LASIX) 20 MG tablet Take 20 mg by mouth daily as needed for fluid.     galantamine (RAZADYNE) 8 MG tablet Take 8 mg by mouth 2 (two) times daily.    levothyroxine  (SYNTHROID, LEVOTHROID) 50 MCG tablet Take 50 mcg by mouth daily before breakfast.    memantine (NAMENDA) 10 MG tablet Take 10 mg by mouth 2 (two) times daily.    Multiple Vitamin (MULTIVITAMIN WITH MINERALS) TABS tablet Take 1 tablet by mouth daily.    QUEtiapine (SEROQUEL) 25 MG tablet Take 25 mg by mouth at bedtime.     tamsulosin (FLOMAX) 0.4 MG CAPS capsule Take 0.4 mg by mouth Nightly.    Melatonin 5 MG CAPS Take 5 mg by mouth at bedtime.        ALLERGIES:  No Known Allergies  BRIEF HPI:  See H&P, Labs, Consult and Test reports for all details in brief, patient was admitted for evaluation of unsteady gait due to left leg "giving out".  CONSULTATIONS:   cardiology  PERTINENT RADIOLOGIC STUDIES: Dg Chest 2 View  01/14/2015  CLINICAL DATA:  Altered mental status. History of colon carcinoma. Hypertension. EXAM: CHEST  2 VIEW COMPARISON:  None. FINDINGS: There is cardiomegaly with small pleural effusions bilaterally. No edema or consolidation. Pacemaker lead is attached to the right ventricle. No pneumothorax. No adenopathy. There is slight pulmonary venous hypertension. There is atherosclerotic calcification in the aorta. There is degenerative change in the thoracic spine. IMPRESSION: Evidence of pulmonary vascular congestion. Small pleural effusions bilaterally. No frank edema or consolidation. Electronically Signed   By: Lowella Grip  III M.D.   On: 01/14/2015 16:12   Dg Ankle Complete Left  01/14/2015  CLINICAL DATA:  Pain.  No history of recent trauma EXAM: LEFT ANKLE COMPLETE - 3+ VIEW COMPARISON:  None. FINDINGS: Frontal, oblique and lateral views were obtained. There is no demonstrable fracture or joint effusion. The ankle mortise appears intact. There are small spurs arising from the posterior inferior calcaneus. There is narrowing in the medial aspect of the joint. There are multiple foci of arterial vascular calcification. IMPRESSION: Osteoarthritic change. Small calcaneal  spurs. Extensive arterial vascular calcification. No fracture. Ankle mortise appears intact. Electronically Signed   By: Lowella Grip III M.D.   On: 01/14/2015 16:13   Ct Head Wo Contrast  01/14/2015  CLINICAL DATA:  81 year old diabetic hypertensive male with weakness and lower extremity pain. Initial encounter. EXAM: CT HEAD WITHOUT CONTRAST TECHNIQUE: Contiguous axial images were obtained from the base of the skull through the vertex without intravenous contrast. COMPARISON:  None. FINDINGS: No skull fracture or intracranial hemorrhage. Right thalamic infarcts of indeterminate age, possibly remote. Remote left basal ganglia infarcts. Small vessel disease type changes without CT evidence of large acute infarct. Global atrophy without hydrocephalus. No intracranial mass lesion noted on this unenhanced exam. Vascular calcifications. IMPRESSION: Right thalamic infarcts of indeterminate age, possibly remote. Remote left basal ganglia infarcts. Small vessel disease type changes without CT evidence of large acute infarct. Global atrophy without hydrocephalus. Electronically Signed   By: Genia Del M.D.   On: 01/14/2015 16:10   US Venous Img Lower Unilateral Left  01/14/2015  CLINICAL DATA:  Left lower extremity pain for the past 2 hours. History of malignancy. Evaluate for DVT. EXAM: LEFT LOWER EXTREMITY VENOUS DOPPLER ULTRASOUND TECHNIQUE: Gray-scale sonography with graded compression, as well as color Doppler and duplex ultrasound were performed to evaluate the lower extremity deep venous systems from the level of the common femoral vein and including the common femoral, femoral, profunda femoral, popliteal and calf veins including the posterior tibial, peroneal and gastrocnemius veins when visible. The superficial great saphenous vein was also interrogated. Spectral Doppler was utilized to evaluate flow at rest and with distal augmentation maneuvers in the common femoral, femoral and popliteal veins.  COMPARISON:  Left foot radiographs - earlier same day FINDINGS: Contralateral Common Femoral Vein: Respiratory phasicity is normal and symmetric with the symptomatic side. No evidence of thrombus. Normal compressibility. Common Femoral Vein: No evidence of thrombus. Normal compressibility, respiratory phasicity and response to augmentation. Saphenofemoral Junction: No evidence of thrombus. Normal compressibility and flow on color Doppler imaging. Profunda Femoral Vein: No evidence of thrombus. Normal compressibility and flow on color Doppler imaging. Femoral Vein: No evidence of thrombus. Normal compressibility, respiratory phasicity and response to augmentation. Popliteal Vein: No evidence of thrombus. Normal compressibility, respiratory phasicity and response to augmentation. Calf Veins: No evidence of thrombus. Normal compressibility and flow on color Doppler imaging. Superficial Great Saphenous Vein: No evidence of thrombus. Normal compressibility and flow on color Doppler imaging. Venous Reflux:  None. Other Findings:  None. IMPRESSION: No evidence of DVT within the left lower extremity. Electronically Signed   By: Sandi Mariscal M.D.   On: 01/14/2015 16:43     PERTINENT LAB RESULTS: CBC:  Recent Labs  01/14/15 2223 01/15/15 0221  WBC 7.1 6.4  HGB 13.3 12.2*  HCT 40.2 37.1*  PLT 154 146*   CMET CMP     Component Value Date/Time   NA 133* 01/15/2015 0221   K 3.9 01/15/2015 0221  CL 104 01/15/2015 0221   CO2 22 01/15/2015 0221   GLUCOSE 80 01/15/2015 0221   BUN 13 01/15/2015 0221   CREATININE 0.94 01/15/2015 0221   CALCIUM 8.9 01/15/2015 0221   PROT 7.3 01/14/2015 1520   ALBUMIN 3.6 01/14/2015 1520   AST 28 01/14/2015 1520   ALT 19 01/14/2015 1520   ALKPHOS 129* 01/14/2015 1520   BILITOT 0.8 01/14/2015 1520   GFRNONAA >60 01/15/2015 0221   GFRAA >60 01/15/2015 0221    GFR Estimated Creatinine Clearance: 51.5 mL/min (by C-G formula based on Cr of 0.94). No results for  input(s): LIPASE, AMYLASE in the last 72 hours.  Recent Labs  01/14/15 2223 01/15/15 0221 01/15/15 0943  TROPONINI 0.03 0.03 0.04*   Invalid input(s): POCBNP No results for input(s): DDIMER in the last 72 hours. No results for input(s): HGBA1C in the last 72 hours.  Recent Labs  01/15/15 0221  CHOL 113  HDL 36*  LDLCALC 66  TRIG 55  CHOLHDL 3.1    Recent Labs  01/14/15 2223  TSH 4.233   No results for input(s): VITAMINB12, FOLATE, FERRITIN, TIBC, IRON, RETICCTPCT in the last 72 hours. Coags:  Recent Labs  01/14/15 2223  INR 1.27   Microbiology: No results found for this or any previous visit (from the past 240 hour(s)).   BRIEF HOSPITAL COURSE:  ? Left foot pain-?weakness: Do not see any signs of left leg weakness on exam. Patient able to left and keep legs up on command. No joint swelling or erythema evident as well. No tenderness elicited on exam. Not sure what exactly led to his left leg giving out yesterday. Left lower extremity Dopplers negative for DVT. X-ray of the left ankle negative. CT head negative for acute abnormalities-? Tiny CVA not seen on CT scan. Carotid Doppler (prelim) showed right 80-99% stenosis-however based on velocity 60-79% stenosis-this M.D. spoke with [facility] on-call Dr. Jerold Coombe recommended that carotid endarterectomy is usually done if symptomatic and Parrish than 80% based on velocity. This M.D. subsequently spoke with patient's daughter Alcide Evener) at bedside-given poor overall health, significant decline in patient's systolic function and advanced dementia-she prefers that this be managed medically and does not want to be considered for carotid endarterectomy. Furthermore, not even sure that patient's presenting symptoms are attributable to this finding. Subsequently seen by physical therapy, recommendations are for SNF on discharge..  Active Problems: Minimally elevated initial troponin level: Subsequent troponin levels negative.  2-D  echocardiogram-showed severely decreased ejection fraction around 15-20% with severe hypokinesis globally. PA pressures 76 mmHg. Per cardiology, not a candidate for beta blocker (lead to Parrish V pacing) or ACEI (BP Soft)  Chronic systolic heart failure: Clinically compensated-continue Lasix.Echo as above  Paroxysmal atrial fibrillation: Suspect poor candidate for long-term anticoagulation given advanced dementia and fall risk-followed by cardiology as an outpatient-into the aspirin/Plavix, cardiology recommending to decrease the digoxin dosing. Follow.  History of carotid artery disease: Reviewed outpatient cardiology note from his primary cardiologist at [facility]-60-79% on the right-repeat carotid Doppler pending-continue aspirin/Plavix and Lipitor-given advanced age, frailty, dementia-doubt patient would be a good candidate for any intervention. Suspect medical management would be the most appropriate in this setting.  History of CAD status post CABG: Continue with antiplatelet agents, statin. No chest pain or shortness of breath  BPH: Continue with Flomax  Hypothyroidism: Continue with Synthroid  Known history of right carotid artery stenosis: Per review of outpatient records-patient had 60-79% stenosis on the right side. Carotid Doppler this admission showed -right ICA  80-99% stenosis however based on velocity appears to be around 60-79% stenosis. Case was discussed with vascular surgery on-call Dr. Jerold Coombe suggested that since it is less than 80% stenosis by velocity, that probably does not need to be addressed by carotid endarterectomy. Furthermore, patient is very frail with advanced dementia, significant decrease in ejection fraction-and family (daughter-Anna Gerlin- at bedside) is reluctant to pursue any surgical measures-suspect that this is best managed medically. Continue with antiplatelet agents and statin. Suggest if family changes their mind, pursue vascular surgery consultation as  an outpatient  Dementia: Pleasantly confused-at baseline-continue Celexa and Seroquel.  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 rate 18, height 5\' 8"  (1.727 m), weight 78.019 kg (172 lb), SpO2 96 %.  Intake/Output Summary (Last 24 hours) at 01/16/15 1601 Last data filed at 01/15/15 1854  Gross per 24 hour  Intake    480 ml  Output    300 ml  Net    180 ml   Filed Weights   01/14/15 1251 01/14/15 2100 01/16/15 0554  Weight: 80.287 kg (177 lb) 78.6 kg (173 lb 4.5 oz) 78.019 kg (172 lb)    Exam Awake Alert, pleasantly confused, No new F.N deficits, Normal affect [place].AT,PERRAL Supple Neck,No JVD, No cervical lymphadenopathy appriciated.  Symmetrical Chest wall movement, Good air movement bilaterally, CTAB RRR,No Gallops,Rubs or new Murmurs, No Parasternal Heave +ve B.Sounds, Abd Soft, Non tender, No organomegaly appriciated, No rebound -guarding or rigidity. No Cyanosis, Clubbing or edema, No new Rash or bruise  DISCHARGE CONDITION: Stable  DISPOSITION: SNF  DISCHARGE INSTRUCTIONS:    Activity:  As tolerated with Full fall precautions use walker/cane & assistance as needed  Get Medicines reviewed and adjusted: Please take all your medications with you for your next visit with your Primary MD  Please request your Primary MD to go over all hospital tests and procedure/radiological results at the follow up, please ask your Primary MD to get all Hospital records sent to his/her office.  If you experience worsening of your admission symptoms, develop shortness of breath, life threatening emergency, suicidal or homicidal thoughts you must seek medical attention immediately by calling 911 or  calling your MD immediately  if symptoms less severe.  You must read complete instructions/literature along with all the possible adverse reactions/side effects for all the Medicines you take and that have been prescribed to you. Take any new Medicines after you have completely understood and accpet all the possible adverse reactions/side effects.   Do not drive when taking Pain medications.   Do not take Parrish than prescribed Pain, Sleep and Anxiety Medications  Special Instructions: If you have smoked or chewed Tobacco  in the last 2 yrs please stop smoking, stop any regular Alcohol  and or any Recreational drug use.  Wear Seat belts while driving.  Please note  You were cared for by a hospitalist during your hospital stay. Once you are discharged, your primary care physician will handle any further medical issues. Please note that NO REFILLS for any discharge medications will be authorized once you are discharged, as it is imperative that you return to your primary care physician (or establish a relationship with a primary care physician if you do not have one) for your aftercare  needs so that they can reassess your need for medications and monitor your lab values.   Diet recommendation: Heart Healthy diet Fluid restriction 1.5 lit/day  Discharge Instructions    Call MD for:  persistant dizziness or light-headedness    Complete by:  As directed      Diet - low sodium heart healthy    Complete by:  As directed      Increase activity slowly    Complete by:  As directed            Follow-up Information    Follow up with Rhetta Mura, MD. Schedule an appointment as soon as possible for a visit in 2 weeks.   Specialty:  Cardiology   Contact information:   [address] [place] [address] [phone]       Total Time spent on discharge equals  45 minutes.  SignedOren Binet 01/16/2015 4:01 PM

## 2015-01-21 ENCOUNTER — Non-Acute Institutional Stay (SKILLED_NURSING_FACILITY): Payer: Medicare Other | Admitting: Internal Medicine

## 2015-01-21 DIAGNOSIS — I482 Chronic atrial fibrillation, unspecified: Secondary | ICD-10-CM

## 2015-01-21 DIAGNOSIS — E119 Type 2 diabetes mellitus without complications: Secondary | ICD-10-CM | POA: Diagnosis not present

## 2015-01-21 DIAGNOSIS — E038 Other specified hypothyroidism: Secondary | ICD-10-CM | POA: Diagnosis not present

## 2015-01-21 DIAGNOSIS — I10 Essential (primary) hypertension: Secondary | ICD-10-CM | POA: Diagnosis not present

## 2015-01-21 DIAGNOSIS — E785 Hyperlipidemia, unspecified: Secondary | ICD-10-CM | POA: Diagnosis not present

## 2015-01-21 DIAGNOSIS — I5022 Chronic systolic (congestive) heart failure: Secondary | ICD-10-CM | POA: Diagnosis not present

## 2015-01-21 DIAGNOSIS — E034 Atrophy of thyroid (acquired): Secondary | ICD-10-CM | POA: Diagnosis not present

## 2015-01-21 DIAGNOSIS — Z950 Presence of cardiac pacemaker: Secondary | ICD-10-CM | POA: Diagnosis not present

## 2015-01-21 DIAGNOSIS — F0150 Vascular dementia without behavioral disturbance: Secondary | ICD-10-CM

## 2015-01-23 ENCOUNTER — Encounter: Payer: Self-pay | Admitting: Internal Medicine

## 2015-01-23 NOTE — Progress Notes (Signed)
Patient ID: Kenneth Parrish, male   DOB: May 19, 1923, 81 y.o.   MRN: [id number]    HISTORY AND PHYSICAL   DATE: 01/21/15  Location:  [facility]    Place of Service: SNF [phone])   Extended Emergency Contact Information Primary Emergency Contact: Alcide Evener Address: [address]          [place], [place] [address] [place] Phone: [phone] Relation: Daughter  Advanced Directive information  DNR; [facility] PATIENT  Chief Complaint  Patient presents with  . New Admit To SNF    HPI:  81 yo male seen today as a new admission into SNF following hospital stay for advanced dementia and CHF with reduced EF. Carotid study revealed right 80-995 stenosis. Vascular sx  recommended CEA but family declined due to pt's poor prognosis overall. 2D echo revealed reduced EF 15-20% with severe hypokinesis globally with elevated PA press. Pt not a candidate for BB (may cause more V pacing) or ACEI (due to soft BPs). Dig dose adjusted. He presents to SNF for short term rehab  He has no c/o. He is a poor historian due to dementia. Hx obtained from chart. Pt is followed by hospice. No nursing issues. No falls  PAF - rate controlled on digoxin. S/p pacer. Takes ASA/plavix for anticoagulation. Poor coumadin candidate  Hx HF/CAD/hx HTN - reduced EF. S/P CABG.  He takes lasix, ASA/plavix  Hyperlipidemia - stable on lipitor. LDL 66  Hypothyroid - stable on levothyroxine. TSH 4.23  DM - CBGs stable 80-120s in hospital. Diet controlled  Dementia - probable vascular dementia. progressively worsening. Takes razadyne and namenda. seroquel to stabilize mood qhs. Prognosis poor  Anxiety/depression/insomnia - mood stable on celexa. Sleeps well on melatonin  BPH - stable on flomax  Past Medical History  Diagnosis Date  . Dementia   . Colon cancer ([place])   . Diabetes mellitus without complication ([place])   . High cholesterol   . Hypertension   . Thyroid disease   .  Coronary artery disease   . Presence of permanent cardiac pacemaker   . Stroke ([place])   . CHF (congestive heart failure) ([place])   . Depression   . Anxiety   . Hypothyroidism     Past Surgical History  Procedure Laterality Date  . Coronary artery bypass graft  1985  . Cardiac catheterization  2010    with  . Insert / replace / remove pacemaker  2010  . Colon surgery      No care team member to display  Social History   Social History  . Marital Status: Widowed    Spouse Name: N/A  . Number of Children: N/A  . Years of Education: N/A   Occupational History  . Not on file.   Social History Main Topics  . Smoking status: Former Smoker    Types: Cigars  . Smokeless tobacco: Not on file  . Alcohol Use: No  . Drug Use: Not on file  . Sexual Activity: Not on file   Other Topics Concern  . Not on file   Social History Narrative     reports that he has quit smoking. His smoking use included Cigars. He does not have any smokeless tobacco history on file. He reports that he does not drink alcohol. His drug history is not on file.  No family history on file. No family status information on file.     There is no immunization history on file for this  patient.  No Known Allergies  Medications: Patient's Medications  New Prescriptions   No medications on file  Previous Medications   ASPIRIN 81 MG TABLET    Take 81 mg by mouth daily.   ATORVASTATIN (LIPITOR) 40 MG TABLET    Take 20 mg by mouth daily.   B COMPLEX-C (B-COMPLEX WITH VITAMIN C) TABLET    Take 1 tablet by mouth daily.   CITALOPRAM (CELEXA) 40 MG TABLET    Take 20 mg by mouth daily.    CLOPIDOGREL (PLAVIX) 75 MG TABLET    Take 75 mg by mouth daily.   DIGOXIN (LANOXIN) 0.25 MG TABLET    Take 0.5 tablets (0.125 mg total) by mouth daily.   FUROSEMIDE (LASIX) 20 MG TABLET    Take 20 mg by mouth daily as needed for fluid.    GALANTAMINE (RAZADYNE) 8 MG TABLET    Take 8 mg by mouth 2 (two) times daily.    LEVOTHYROXINE (SYNTHROID, LEVOTHROID) 50 MCG TABLET    Take 50 mcg by mouth daily before breakfast.   MELATONIN 5 MG CAPS    Take 5 mg by mouth at bedtime.    MEMANTINE (NAMENDA) 10 MG TABLET    Take 10 mg by mouth 2 (two) times daily.   MULTIPLE VITAMIN (MULTIVITAMIN WITH MINERALS) TABS TABLET    Take 1 tablet by mouth daily.   QUETIAPINE (SEROQUEL) 25 MG TABLET    Take 25 mg by mouth at bedtime.    TAMSULOSIN (FLOMAX) 0.4 MG CAPS CAPSULE    Take 0.4 mg by mouth Nightly.  Modified Medications   No medications on file  Discontinued Medications   No medications on file    Review of Systems  Unable to perform ROS: Dementia    Filed Vitals:   01/21/15 2100  BP: 110/73  Pulse: 60  Temp: 98.1 F (36.7 C)  Weight: 163 lb (73.936 kg)   Body mass index is 24.79 kg/(m^2).  Physical Exam  Constitutional: He appears well-developed.  Sitting in chair at bedside, frail appearing  HENT:  Mouth/Throat: Oropharynx is clear and moist.  Eyes: Pupils are equal, round, and reactive to light. No scleral icterus.  Neck: Neck supple. Carotid bruit is not present.  Cardiovascular: Normal rate, regular rhythm and intact distal pulses.  Exam reveals no gallop and no friction rub.   Murmur (1/6 SEM) heard. Trace LE edema b/l. No calf TTP  Pulmonary/Chest: Effort normal and breath sounds normal. He has no wheezes. He has no rales. He exhibits no tenderness.  Palpable ACW pacer  Abdominal: Soft. Bowel sounds are normal. He exhibits no distension, no abdominal bruit, no pulsatile midline mass and no mass. There is no tenderness. There is no rebound and no guarding.  Lymphadenopathy:    He has no cervical adenopathy.  Neurological: He is alert.  Skin: Skin is warm and dry. No rash noted.  Ecchymosis noted on abdomen  Psychiatric: He has a normal mood and affect. His behavior is normal.     Labs reviewed: Admission on 01/14/2015, Discharged on 01/16/2015  Component Date Value Ref Range Status  .  Color, Urine 01/14/2015 YELLOW  YELLOW Final  . APPearance 01/14/2015 CLEAR  CLEAR Final  . Specific Gravity, Urine 01/14/2015 1.019  1.005 - 1.030 Final  . pH 01/14/2015 6.0  5.0 - 8.0 Final  . Glucose, UA 01/14/2015 NEGATIVE  NEGATIVE mg/dL Final  . Hgb urine dipstick 01/14/2015 NEGATIVE  NEGATIVE Final  . Bilirubin Urine 01/14/2015 NEGATIVE  NEGATIVE Final  . Ketones, ur 01/14/2015 NEGATIVE  NEGATIVE mg/dL Final  . Protein, ur 01/14/2015 NEGATIVE  NEGATIVE mg/dL Final  . Urobilinogen, UA 01/14/2015 0.2  0.0 - 1.0 mg/dL Final  . Nitrite 01/14/2015 NEGATIVE  NEGATIVE Final  . Leukocytes, UA 01/14/2015 SMALL* NEGATIVE Final  . Glucose-Capillary 01/14/2015 84  65 - 99 mg/dL Final  . WBC 01/14/2015 7.3  4.0 - 10.5 K/uL Final  . RBC 01/14/2015 4.29  4.22 - 5.81 MIL/uL Final  . Hemoglobin 01/14/2015 13.2  13.0 - 17.0 g/dL Final  . HCT 01/14/2015 39.8  39.0 - 52.0 % Final  . MCV 01/14/2015 92.8  78.0 - 100.0 fL Final  . MCH 01/14/2015 30.8  26.0 - 34.0 pg Final  . MCHC 01/14/2015 33.2  30.0 - 36.0 g/dL Final  . RDW 01/14/2015 15.9* 11.5 - 15.5 % Final  . Platelets 01/14/2015 161  150 - 400 K/uL Final  . Neutrophils Relative % 01/14/2015 57   Final  . Neutro Abs 01/14/2015 4.2  1.7 - 7.7 K/uL Final  . Lymphocytes Relative 01/14/2015 30   Final  . Lymphs Abs 01/14/2015 2.2  0.7 - 4.0 K/uL Final  . Monocytes Relative 01/14/2015 10   Final  . Monocytes Absolute 01/14/2015 0.7  0.1 - 1.0 K/uL Final  . Eosinophils Relative 01/14/2015 2   Final  . Eosinophils Absolute 01/14/2015 0.2  0.0 - 0.7 K/uL Final  . Basophils Relative 01/14/2015 1   Final  . Basophils Absolute 01/14/2015 0.1  0.0 - 0.1 K/uL Final  . Sodium 01/14/2015 140  135 - 145 mmol/L Final  . Potassium 01/14/2015 4.2  3.5 - 5.1 mmol/L Final  . Chloride 01/14/2015 107  101 - 111 mmol/L Final  . CO2 01/14/2015 25  22 - 32 mmol/L Final  . Glucose, Bld 01/14/2015 92  65 - 99 mg/dL Final  . BUN 01/14/2015 18  6 - 20 mg/dL Final  .  Creatinine, Ser 01/14/2015 1.07  0.61 - 1.24 mg/dL Final  . Calcium 01/14/2015 9.1  8.9 - 10.3 mg/dL Final  . Total Protein 01/14/2015 7.3  6.5 - 8.1 g/dL Final  . Albumin 01/14/2015 3.6  3.5 - 5.0 g/dL Final  . AST 01/14/2015 28  15 - 41 U/L Final  . ALT 01/14/2015 19  17 - 63 U/L Final  . Alkaline Phosphatase 01/14/2015 129* 38 - 126 U/L Final  . Total Bilirubin 01/14/2015 0.8  0.3 - 1.2 mg/dL Final  . GFR calc non Af Amer 01/14/2015 59* >60 mL/min Final  . GFR calc Af Amer 01/14/2015 >60  >60 mL/min Final   Comment: (NOTE) The eGFR has been calculated using the CKD EPI equation. This calculation has not been validated in all clinical situations. eGFR's persistently <60 mL/min signify possible Chronic Kidney Disease.   . Anion gap 01/14/2015 8  5 - 15 Final  . Troponin I 01/14/2015 0.04* <0.031 ng/mL Final   Comment:        PERSISTENTLY INCREASED TROPONIN VALUES IN THE RANGE OF 0.04-0.49 ng/mL CAN BE SEEN IN:       -UNSTABLE ANGINA       -CONGESTIVE HEART FAILURE       -MYOCARDITIS       -CHEST TRAUMA       -ARRYHTHMIAS       -LATE PRESENTING MYOCARDIAL INFARCTION       -COPD   CLINICAL FOLLOW-UP RECOMMENDED.   Marland Kitchen Squamous Epithelial / LPF 01/14/2015 RARE  RARE Final  .  WBC, UA 01/14/2015 3-6  <3 WBC/hpf Final  . Bacteria, UA 01/14/2015 RARE  RARE Final  . WBC 01/14/2015 7.1  4.0 - 10.5 K/uL Final  . RBC 01/14/2015 4.29  4.22 - 5.81 MIL/uL Final  . Hemoglobin 01/14/2015 13.3  13.0 - 17.0 g/dL Final  . HCT 01/14/2015 40.2  39.0 - 52.0 % Final  . MCV 01/14/2015 93.7  78.0 - 100.0 fL Final  . MCH 01/14/2015 31.0  26.0 - 34.0 pg Final  . MCHC 01/14/2015 33.1  30.0 - 36.0 g/dL Final  . RDW 01/14/2015 15.8* 11.5 - 15.5 % Final  . Platelets 01/14/2015 154  150 - 400 K/uL Final  . Creatinine, Ser 01/14/2015 0.91  0.61 - 1.24 mg/dL Final  . GFR calc non Af Amer 01/14/2015 >60  >60 mL/min Final  . GFR calc Af Amer 01/14/2015 >60  >60 mL/min Final   Comment: (NOTE) The eGFR has  been calculated using the CKD EPI equation. This calculation has not been validated in all clinical situations. eGFR's persistently <60 mL/min signify possible Chronic Kidney Disease.   . Free T4 01/14/2015 0.84  0.61 - 1.12 ng/dL Final  . TSH 01/14/2015 4.233  0.350 - 4.500 uIU/mL Final  . Troponin I 01/14/2015 0.03  <0.031 ng/mL Final   Comment:        NO INDICATION OF MYOCARDIAL INJURY.   . Troponin I 01/15/2015 0.03  <0.031 ng/mL Final   Comment:        NO INDICATION OF MYOCARDIAL INJURY.   . Troponin I 01/15/2015 0.04* <0.031 ng/mL Final   Comment:        PERSISTENTLY INCREASED TROPONIN VALUES IN THE RANGE OF 0.04-0.49 ng/mL CAN BE SEEN IN:       -UNSTABLE ANGINA       -CONGESTIVE HEART FAILURE       -MYOCARDITIS       -CHEST TRAUMA       -ARRYHTHMIAS       -LATE PRESENTING MYOCARDIAL INFARCTION       -COPD   CLINICAL FOLLOW-UP RECOMMENDED.   Marland Kitchen aPTT 01/14/2015 36  24 - 37 seconds Final  . Prothrombin Time 01/14/2015 16.0* 11.6 - 15.2 seconds Final  . INR 01/14/2015 1.27  0.00 - 1.49 Final  . Digoxin Level 01/14/2015 0.8  0.8 - 2.0 ng/mL Final  . WBC 01/15/2015 6.4  4.0 - 10.5 K/uL Final  . RBC 01/15/2015 4.01* 4.22 - 5.81 MIL/uL Final  . Hemoglobin 01/15/2015 12.2* 13.0 - 17.0 g/dL Final  . HCT 01/15/2015 37.1* 39.0 - 52.0 % Final  . MCV 01/15/2015 92.5  78.0 - 100.0 fL Final  . MCH 01/15/2015 30.4  26.0 - 34.0 pg Final  . MCHC 01/15/2015 32.9  30.0 - 36.0 g/dL Final  . RDW 01/15/2015 15.7* 11.5 - 15.5 % Final  . Platelets 01/15/2015 146* 150 - 400 K/uL Final  . Sodium 01/15/2015 133* 135 - 145 mmol/L Final  . Potassium 01/15/2015 3.9  3.5 - 5.1 mmol/L Final  . Chloride 01/15/2015 104  101 - 111 mmol/L Final  . CO2 01/15/2015 22  22 - 32 mmol/L Final  . Glucose, Bld 01/15/2015 80  65 - 99 mg/dL Final  . BUN 01/15/2015 13  6 - 20 mg/dL Final  . Creatinine, Ser 01/15/2015 0.94  0.61 - 1.24 mg/dL Final  . Calcium 01/15/2015 8.9  8.9 - 10.3 mg/dL Final  . GFR calc  non Af Amer 01/15/2015 >60  >60 mL/min Final  .  GFR calc Af Amer 01/15/2015 >60  >60 mL/min Final   Comment: (NOTE) The eGFR has been calculated using the CKD EPI equation. This calculation has not been validated in all clinical situations. eGFR's persistently <60 mL/min signify possible Chronic Kidney Disease.   . Anion gap 01/15/2015 7  5 - 15 Final  . Cholesterol 01/15/2015 113  0 - 200 mg/dL Final  . Triglycerides 01/15/2015 55  <150 mg/dL Final  . HDL 01/15/2015 36* >40 mg/dL Final  . Total CHOL/HDL Ratio 01/15/2015 3.1   Final  . VLDL 01/15/2015 11  0 - 40 mg/dL Final  . LDL Cholesterol 01/15/2015 66  0 - 99 mg/dL Final   Comment:        Total Cholesterol/HDL:CHD Risk Coronary Heart Disease Risk Table                     Men   Women  1/2 Average Risk   3.4   3.3  Average Risk       5.0   4.4  2 X Average Risk   9.6   7.1  3 X Average Risk  23.4   11.0        Use the calculated Patient Ratio above and the CHD Risk Table to determine the patient's CHD Risk.        ATP III CLASSIFICATION (LDL):  <100     mg/dL   Optimal  100-129  mg/dL   Near or Above                    Optimal  130-159  mg/dL   Borderline  160-189  mg/dL   High  >190     mg/dL   Very High   . Glucose-Capillary 01/15/2015 82  65 - 99 mg/dL Final  . Glucose-Capillary 01/15/2015 105* 65 - 99 mg/dL Final  . Glucose-Capillary 01/15/2015 127* 65 - 99 mg/dL Final    Dg Chest 2 View  01/14/2015  CLINICAL DATA:  Altered mental status. History of colon carcinoma. Hypertension. EXAM: CHEST  2 VIEW COMPARISON:  None. FINDINGS: There is cardiomegaly with small pleural effusions bilaterally. No edema or consolidation. Pacemaker lead is attached to the right ventricle. No pneumothorax. No adenopathy. There is slight pulmonary venous hypertension. There is atherosclerotic calcification in the aorta. There is degenerative change in the thoracic spine. IMPRESSION: Evidence of pulmonary vascular congestion. Small pleural  effusions bilaterally. No frank edema or consolidation. Electronically Signed   By: Lowella Grip III M.D.   On: 01/14/2015 16:12   Dg Ankle Complete Left  01/14/2015  CLINICAL DATA:  Pain.  No history of recent trauma EXAM: LEFT ANKLE COMPLETE - 3+ VIEW COMPARISON:  None. FINDINGS: Frontal, oblique and lateral views were obtained. There is no demonstrable fracture or joint effusion. The ankle mortise appears intact. There are small spurs arising from the posterior inferior calcaneus. There is narrowing in the medial aspect of the joint. There are multiple foci of arterial vascular calcification. IMPRESSION: Osteoarthritic change. Small calcaneal spurs. Extensive arterial vascular calcification. No fracture. Ankle mortise appears intact. Electronically Signed   By: Lowella Grip III M.D.   On: 01/14/2015 16:13   Ct Head Wo Contrast  01/14/2015  CLINICAL DATA:  81 year old diabetic hypertensive male with weakness and lower extremity pain. Initial encounter. EXAM: CT HEAD WITHOUT CONTRAST TECHNIQUE: Contiguous axial images were obtained from the base of the skull through the vertex without intravenous contrast. COMPARISON:  None. FINDINGS: No  skull fracture or intracranial hemorrhage. Right thalamic infarcts of indeterminate age, possibly remote. Remote left basal ganglia infarcts. Small vessel disease type changes without CT evidence of large acute infarct. Global atrophy without hydrocephalus. No intracranial mass lesion noted on this unenhanced exam. Vascular calcifications. IMPRESSION: Right thalamic infarcts of indeterminate age, possibly remote. Remote left basal ganglia infarcts. Small vessel disease type changes without CT evidence of large acute infarct. Global atrophy without hydrocephalus. Electronically Signed   By: Genia Del M.D.   On: 01/14/2015 16:10   US Venous Img Lower Unilateral Left  01/14/2015  CLINICAL DATA:  Left lower extremity pain for the past 2 hours. History of  malignancy. Evaluate for DVT. EXAM: LEFT LOWER EXTREMITY VENOUS DOPPLER ULTRASOUND TECHNIQUE: Gray-scale sonography with graded compression, as well as color Doppler and duplex ultrasound were performed to evaluate the lower extremity deep venous systems from the level of the common femoral vein and including the common femoral, femoral, profunda femoral, popliteal and calf veins including the posterior tibial, peroneal and gastrocnemius veins when visible. The superficial great saphenous vein was also interrogated. Spectral Doppler was utilized to evaluate flow at rest and with distal augmentation maneuvers in the common femoral, femoral and popliteal veins. COMPARISON:  Left foot radiographs - earlier same day FINDINGS: Contralateral Common Femoral Vein: Respiratory phasicity is normal and symmetric with the symptomatic side. No evidence of thrombus. Normal compressibility. Common Femoral Vein: No evidence of thrombus. Normal compressibility, respiratory phasicity and response to augmentation. Saphenofemoral Junction: No evidence of thrombus. Normal compressibility and flow on color Doppler imaging. Profunda Femoral Vein: No evidence of thrombus. Normal compressibility and flow on color Doppler imaging. Femoral Vein: No evidence of thrombus. Normal compressibility, respiratory phasicity and response to augmentation. Popliteal Vein: No evidence of thrombus. Normal compressibility, respiratory phasicity and response to augmentation. Calf Veins: No evidence of thrombus. Normal compressibility and flow on color Doppler imaging. Superficial Great Saphenous Vein: No evidence of thrombus. Normal compressibility and flow on color Doppler imaging. Venous Reflux:  None. Other Findings:  None. IMPRESSION: No evidence of DVT within the left lower extremity. Electronically Signed   By: Sandi Mariscal M.D.   On: 01/14/2015 16:43     Assessment/Plan   ICD-9-CM ICD-10-CM   1. Vascular dementia, without behavioral disturbance  290.40 F01.50   2. Chronic systolic heart failure (HCC) 428.22 I50.22   3. Chronic atrial fibrillation (HCC) 427.31 I48.2   4. Essential hypertension 401.9 I10   5. Diabetes mellitus without complication (HCC) 546.50 E11.9   6. Hypothyroidism due to acquired atrophy of thyroid 244.8 E03.8    246.8 E03.4   7. Hyperlipidemia 272.4 E78.5   8. Pacemaker V45.01 Z95.0    Cont current meds as ordered  Hospice to follow. He has a poor prognosis  GOAL: short term rehab as tolerated followed by possible long term placement. Communicated with pt and nursing.  Will follow  Ortha Metts S. Perlie Gold  [facility] [address] [place], [place] [address] [phone] Cell (Monday-Friday 8 AM - 5 PM) [phone] After 5 PM and follow prompts

## 2015-04-20 ENCOUNTER — Encounter: Payer: Self-pay | Admitting: Internal Medicine

## 2015-05-03 ENCOUNTER — Encounter (HOSPITAL_COMMUNITY): Payer: Self-pay

## 2015-05-03 ENCOUNTER — Emergency Department (HOSPITAL_COMMUNITY)

## 2015-05-03 ENCOUNTER — Emergency Department (HOSPITAL_COMMUNITY)
Admission: EM | Admit: 2015-05-03 | Discharge: 2015-05-03 | Disposition: A | Discharge status: Home / Self Care | Attending: Emergency Medicine | Admitting: Emergency Medicine

## 2015-05-03 DIAGNOSIS — I2510 Atherosclerotic heart disease of native coronary artery without angina pectoris: Secondary | ICD-10-CM | POA: Diagnosis not present

## 2015-05-03 DIAGNOSIS — F329 Major depressive disorder, single episode, unspecified: Secondary | ICD-10-CM | POA: Insufficient documentation

## 2015-05-03 DIAGNOSIS — S0990XA Unspecified injury of head, initial encounter: Secondary | ICD-10-CM

## 2015-05-03 DIAGNOSIS — F0390 Unspecified dementia without behavioral disturbance: Secondary | ICD-10-CM | POA: Diagnosis not present

## 2015-05-03 DIAGNOSIS — Z950 Presence of cardiac pacemaker: Secondary | ICD-10-CM | POA: Diagnosis not present

## 2015-05-03 DIAGNOSIS — Y9289 Other specified places as the place of occurrence of the external cause: Secondary | ICD-10-CM | POA: Insufficient documentation

## 2015-05-03 DIAGNOSIS — E119 Type 2 diabetes mellitus without complications: Secondary | ICD-10-CM | POA: Diagnosis not present

## 2015-05-03 DIAGNOSIS — Y9389 Activity, other specified: Secondary | ICD-10-CM | POA: Diagnosis not present

## 2015-05-03 DIAGNOSIS — Z87891 Personal history of nicotine dependence: Secondary | ICD-10-CM | POA: Diagnosis not present

## 2015-05-03 DIAGNOSIS — Z9889 Other specified postprocedural states: Secondary | ICD-10-CM | POA: Insufficient documentation

## 2015-05-03 DIAGNOSIS — M25561 Pain in right knee: Secondary | ICD-10-CM

## 2015-05-03 DIAGNOSIS — W07XXXA Fall from chair, initial encounter: Secondary | ICD-10-CM | POA: Insufficient documentation

## 2015-05-03 DIAGNOSIS — Y998 Other external cause status: Secondary | ICD-10-CM | POA: Insufficient documentation

## 2015-05-03 DIAGNOSIS — S0081XA Abrasion of other part of head, initial encounter: Secondary | ICD-10-CM | POA: Insufficient documentation

## 2015-05-03 DIAGNOSIS — W19XXXA Unspecified fall, initial encounter: Secondary | ICD-10-CM

## 2015-05-03 DIAGNOSIS — I10 Essential (primary) hypertension: Secondary | ICD-10-CM | POA: Diagnosis not present

## 2015-05-03 DIAGNOSIS — S8002XA Contusion of left knee, initial encounter: Secondary | ICD-10-CM | POA: Diagnosis not present

## 2015-05-03 DIAGNOSIS — Z8673 Personal history of transient ischemic attack (TIA), and cerebral infarction without residual deficits: Secondary | ICD-10-CM | POA: Diagnosis not present

## 2015-05-03 DIAGNOSIS — I509 Heart failure, unspecified: Secondary | ICD-10-CM | POA: Diagnosis not present

## 2015-05-03 DIAGNOSIS — S8001XA Contusion of right knee, initial encounter: Secondary | ICD-10-CM | POA: Insufficient documentation

## 2015-05-03 DIAGNOSIS — Z79899 Other long term (current) drug therapy: Secondary | ICD-10-CM | POA: Diagnosis not present

## 2015-05-03 DIAGNOSIS — E039 Hypothyroidism, unspecified: Secondary | ICD-10-CM | POA: Diagnosis not present

## 2015-05-03 DIAGNOSIS — F419 Anxiety disorder, unspecified: Secondary | ICD-10-CM | POA: Insufficient documentation

## 2015-05-03 DIAGNOSIS — M25562 Pain in left knee: Secondary | ICD-10-CM

## 2015-05-03 DIAGNOSIS — Z85038 Personal history of other malignant neoplasm of large intestine: Secondary | ICD-10-CM | POA: Insufficient documentation

## 2015-05-03 DIAGNOSIS — Z951 Presence of aortocoronary bypass graft: Secondary | ICD-10-CM | POA: Diagnosis not present

## 2015-05-03 NOTE — ED Provider Notes (Signed)
CSN: [id number]     Arrival date & time 05/03/15  1356 History   First MD Initiated Contact with Patient 05/03/15 1408     Chief Complaint  Patient presents with  . Fall     HPI   82 year old male with a history of dementia presents status post fall. He is a resident of [facility] assisted [facility]. Patient was moving from one chair to another this morning when he had a mechanical fall landing on his knees and striking his head. Patient denies loss of consciousness, reports pain to his right knee, denies any other complaints. Patient denies any headache, chest pain, neck pain, shortness of breath, abdominal pain, or pain anywhere other than the right and left knee. Patient reports full active range of motion of the knees hips upper extremities and neck. Initial evaluation was done with the patient alone, secondary evaluation with both his daughter and son at bedside. They report a history of progressive dementia, hospice twice a week, living at [facility] where they're happy with the care. They report the patient has assistance getting in and out of bed, gone to the bathroom, reports that he uses a walker and assistance when moving. They report that he is acting appropriately during my evaluation.   Past Medical History  Diagnosis Date  . Dementia   . Colon cancer ([place])   . Diabetes mellitus without complication ([place])   . High cholesterol   . Hypertension   . Thyroid disease   . Coronary artery disease   . Presence of permanent cardiac pacemaker   . Stroke ([place])   . CHF (congestive heart failure) ([place])   . Depression   . Anxiety   . Hypothyroidism    Past Surgical History  Procedure Laterality Date  . Coronary artery bypass graft  1985  . Cardiac catheterization  2010    with  . Insert / replace / remove pacemaker  2010  . Colon surgery     History reviewed. No pertinent family history. Social History  Substance Use Topics  . Smoking status: Former Smoker    Types:  Cigars  . Smokeless tobacco: None  . Alcohol Use: No    Review of Systems  All other systems reviewed and are negative.   Allergies  Review of patient's allergies indicates no known allergies.  Home Medications   Prior to Admission medications   Medication Sig Start Date End Date Taking? Authorizing Provider  citalopram (CELEXA) 40 MG tablet Take 20 mg by mouth daily.    Yes Historical Provider, MD  digoxin (LANOXIN) 0.25 MG tablet Take 0.5 tablets (0.125 mg total) by mouth daily. 01/16/15  Yes Shanker Kristeen Mans, MD  docusate sodium (COLACE) 100 MG capsule Take 100 mg by mouth daily.   Yes Historical Provider, MD  furosemide (LASIX) 20 MG tablet Take 20 mg by mouth daily.    Yes Historical Provider, MD  levothyroxine (SYNTHROID, LEVOTHROID) 50 MCG tablet Take 50 mcg by mouth daily before breakfast.   Yes Historical Provider, MD  LORazepam (ATIVAN) 0.5 MG tablet Take 0.25 mg by mouth every morning.   Yes Historical Provider, MD  Morphine Sulfate (MORPHINE CONCENTRATE) 10 MG/0.5ML SOLN concentrated solution Take 5 mg by mouth every 2 (two) hours as needed for severe pain or shortness of breath.   Yes Historical Provider, MD  OXYGEN Inhale 2 L into the lungs daily as needed (for exertion).    Yes Historical Provider, MD  QUEtiapine (SEROQUEL) 25 MG tablet Take  25 mg by mouth at bedtime.    Yes Historical Provider, MD  tamsulosin (FLOMAX) 0.4 MG CAPS capsule Take 0.4 mg by mouth at bedtime.    Yes Historical Provider, MD   BP 103/49 mmHg  Pulse 60  Temp(Src) 98.5 F (36.9 C) (Oral)  Resp 16  Ht 5\' 8"  (1.727 m)  Wt 74.844 kg  BMI 25.09 kg/m2  SpO2 97%   Physical Exam  Constitutional: He appears well-developed and well-nourished.  HENT:  Head: Normocephalic and atraumatic.  Superficial abrasion to the right anterior forehead no tenderness or swelling  Eyes: Conjunctivae are normal. Pupils are equal, round, and reactive to light. Right eye exhibits no discharge. Left eye exhibits no  discharge. No scleral icterus.  Neck: Normal range of motion. No JVD present. No tracheal deviation present.  Pulmonary/Chest: Effort normal and breath sounds normal. No stridor. No respiratory distress. He has no wheezes. He has no rales. He exhibits no tenderness.  Abdominal: Soft. He exhibits no mass. There is no tenderness. There is no rebound and no guarding.  Musculoskeletal:  Patient has no CT or L-spine tenderness full active range of motion of the neck, back, hips. Upper and lower extremity strength 5 out of 5, sensation grossly intact. Chest abdomen nontender to palpation. Patient has significant bruising and swelling to the right anterior knee, minor amount of bruising swelling to the left anterior knee. He has full active range of motion greater than 90 of flexion of the knees bilateral. No laxity noted on exam no damage or trauma to the posterior knee.  Neurological: He is alert. No cranial nerve deficit. Coordination normal.  Skin: Skin is warm and dry. No rash noted. No erythema.  Psychiatric: He has a normal mood and affect. His behavior is normal. Judgment and thought content normal.  Nursing note and vitals reviewed.   ED Course  Procedures (including critical care time) Labs Review Labs Reviewed - No data to display  Imaging Review Dg Tibia/fibula Right  05/03/2015  CLINICAL DATA:  Fall today from chair while eating at nursing home patient has poor history due to dementia EXAM: RIGHT TIBIA AND FIBULA - 2 VIEW COMPARISON:  None. FINDINGS: No fracture. No bone lesion. Knee and ankle joints are normally aligned. Bones are demineralized. There are vascular calcifications. Soft tissues otherwise unremarkable. IMPRESSION: No fracture or acute finding. Electronically Signed   By: Lajean Manes M.D.   On: 05/03/2015 15:29   Ct Head Wo Contrast  05/03/2015  CLINICAL DATA:  Small right mid forehead abrasion following a fall at a nursing facility. EXAM: CT HEAD WITHOUT CONTRAST  TECHNIQUE: Contiguous axial images were obtained from the base of the skull through the vertex without intravenous contrast. COMPARISON:  01/14/2015. FINDINGS: Diffusely enlarged ventricles and subarachnoid spaces. Patchy white matter low density in both cerebral hemispheres. Old right pole and yolk lacunar infarct. Old left basal ganglia lacunar infarct. No skull fracture, intracranial hemorrhage or paranasal sinus air-fluid levels. IMPRESSION: 1. No skull fracture or intracranial hemorrhage. 2. Stable atrophy, chronic small vessel white matter ischemic changes and old lacunar infarcts. Electronically Signed   By: Claudie Revering M.D.   On: 05/03/2015 15:44   Dg Knee Complete 4 Views Left  05/03/2015  CLINICAL DATA:  Fall today from chair while eating at nursing home patient has poor history due to dementia EXAM: LEFT KNEE - COMPLETE 4+ VIEW COMPARISON:  None. FINDINGS: No acute fracture.  No dislocation. Medial joint space compartment narrowing. Small marginal osteophytes from  medial compartment and patella. Bones are demineralized.  No bone lesion. No joint effusion. There are dense vascular calcifications posteriorly. IMPRESSION: No acute fracture or dislocation. Electronically Signed   By: Lajean Manes M.D.   On: 05/03/2015 15:28   Dg Knee Complete 4 Views Right  05/03/2015  CLINICAL DATA:  Right knee pain after falling from her chair while eating at her nursing home today. Dementia. EXAM: RIGHT KNEE - COMPLETE 4+ VIEW COMPARISON:  None. FINDINGS: Marked medial joint space narrowing. Mild to moderate medial spur formation. Mild lateral and posterior patellar spur formation. Anterior soft tissue swelling. No fracture, dislocation or effusion seen. Curvilinear, corticated ossification adjacent to the medial femoral condyle. Atheromatous arterial calcifications. IMPRESSION: 1. Anterior soft tissue swelling without fracture or effusion. 2. Tricompartmental degenerative changes, including marked medial  compartment degenerative change. 3. Atheromatous arterial calcifications. Electronically Signed   By: Claudie Revering M.D.   On: 05/03/2015 15:28   I have personally reviewed and evaluated these images and lab results as part of my medical decision-making.   EKG Interpretation None      MDM   Final diagnoses:  Fall, initial encounter  Arthralgia of both knees  Head injury, initial encounter    Labs:   Imaging:DG tib-fib, DG knee complete right, DG knee complete left, CT head without contrast  Consults:  Therapeutics:  Discharge Meds:   Assessment/Plan: 82 year old male presents status post mechanical fall. This was a witnessed fall at the nursing home. Patient had no preceding complaints, low suspicion for any cardiac neurogenic cause of the fall. Patient reports he has pain to the right knees bilateral, has significant swelling to the right knee but entertains full active range of motion. No acute fracture on exam anterior soft tissue swelling, no joint effusion. Patient is able to ambulate with assistance here in the ED, this is patient's baseline. Patient was offered pain medication here he refused the medication. Family was present throughout the exam, patient is at his baseline, with no acute findings on exam he has very close care at his nursing facility, patient will be discharged home to nursing facility with family. They're given strict return caution's, verbalized understanding and agreement today's plan and had no further questions or concerns at the time of discharge.          Okey Regal, PA-C 05/03/15 1840  Gareth Morgan, MD 05/05/15 2253

## 2015-05-03 NOTE — ED Notes (Addendum)
Pt arrives EMS with c/o witnessed fall at [facility]. Denies LOC with c/o pain at r knee. Abrasion noted at forehead. No bleeding noted.

## 2015-05-03 NOTE — ED Notes (Signed)
Pt oob to chair with assist. Very poor balance but able to bear weight .

## 2015-05-03 NOTE — ED Notes (Signed)
Report to NH.

## 2015-05-03 NOTE — Discharge Instructions (Signed)
Fall Prevention in Hospitals, Adult As a hospital patient, your condition and the treatments you receive can increase your risk for falls. Some additional risk factors for falls in a hospital include:  Being in an unfamiliar environment.  Being on bed rest.  Your surgery.  Taking certain medicines.  Your tubing requirements, such as intravenous (IV) therapy or catheters. It is important that you learn how to decrease fall risks while at the hospital. Below are important tips that can help prevent falls. SAFETY TIPS FOR PREVENTING FALLS Talk about your risk of falling.  Ask your health care provider why you are at risk for falling. Is it your medicine, illness, tubing placement, or something else?  Make a plan with your health care provider to keep you safe from falls.  Ask your health care provider or pharmacist about side effects of your medicines. Some medicines can make you dizzy or affect your coordination. Ask for help.  Ask for help before getting out of bed. You may need to press your call button.  Ask for assistance in getting safely to the toilet.  Ask for a walker or cane to be put at your bedside. Ask that most of the side rails on your bed be placed up before your health care provider leaves the room.  Ask family or friends to sit with you.  Ask for things that are out of your reach, such as your glasses, hearing aids, telephone, bedside table, or call button. Follow these tips to avoid falling:  Stay lying or seated, rather than standing, while waiting for help.  Wear rubber-soled slippers or shoes whenever you walk in the hospital.  Avoid quick, sudden movements.  Change positions slowly.  Sit on the side of your bed before standing.  Stand up slowly and wait before you start to walk.  Let your health care provider know if there is a spill on the floor.  Pay careful attention to the medical equipment, electrical cords, and tubes around you.  When you  need help, use your call button by your bed or in the bathroom. Wait for one of your health care providers to help you.  If you feel dizzy or unsure of your footing, return to bed and wait for assistance.  Avoid being distracted by the TV, telephone, or another person in your room.  Do not lean or support yourself on rolling objects, such as IV poles or bedside tables.   This information is not intended to replace advice given to you by your health care provider. Make sure you discuss any questions you have with your health care provider.   Document Released: 02/27/2000 Document Revised: 03/22/2014 Document Reviewed: 11/07/2011 Elsevier Interactive Patient Education 2016 [employer].  Cryotherapy Cryotherapy means treatment with cold. Ice or gel packs can be used to reduce both pain and swelling. Ice is the most helpful within the first 24 to 48 hours after an injury or flare-up from overusing a muscle or joint. Sprains, strains, spasms, burning pain, shooting pain, and aches can all be eased with ice. Ice can also be used when recovering from surgery. Ice is effective, has very few side effects, and is safe for most people to use. PRECAUTIONS  Ice is not a safe treatment option for people with:  Raynaud phenomenon. This is a condition affecting small blood vessels in the extremities. Exposure to cold may cause your problems to return.  Cold hypersensitivity. There are many forms of cold hypersensitivity, including:  Cold urticaria. Red,  itchy hives appear on the skin when the tissues begin to warm after being iced.  Cold erythema. This is a red, itchy rash caused by exposure to cold.  Cold hemoglobinuria. Red blood cells break down when the tissues begin to warm after being iced. The hemoglobin that carry oxygen are passed into the urine because they cannot combine with blood proteins fast enough.  Numbness or altered sensitivity in the area being iced. If you have any of the  following conditions, do not use ice until you have discussed cryotherapy with your caregiver:  Heart conditions, such as arrhythmia, angina, or chronic heart disease.  High blood pressure.  Healing wounds or open skin in the area being iced.  Current infections.  Rheumatoid arthritis.  Poor circulation.  Diabetes. Ice slows the blood flow in the region it is applied. This is beneficial when trying to stop inflamed tissues from spreading irritating chemicals to surrounding tissues. However, if you expose your skin to cold temperatures for too long or without the proper protection, you can damage your skin or nerves. Watch for signs of skin damage due to cold. HOME CARE INSTRUCTIONS Follow these tips to use ice and cold packs safely.  Place a dry or damp towel between the ice and skin. A damp towel will cool the skin more quickly, so you may need to shorten the time that the ice is used.  For a more rapid response, add gentle compression to the ice.  Ice for no more than 10 to 20 minutes at a time. The bonier the area you are icing, the less time it will take to get the benefits of ice.  Check your skin after 5 minutes to make sure there are no signs of a poor response to cold or skin damage.  Rest 20 minutes or more between uses.  Once your skin is numb, you can end your treatment. You can test numbness by very lightly touching your skin. The touch should be so light that you do not see the skin dimple from the pressure of your fingertip. When using ice, most people will feel these normal sensations in this order: cold, burning, aching, and numbness.  Do not use ice on someone who cannot communicate their responses to pain, such as small children or people with dementia. HOW TO MAKE AN ICE PACK Ice packs are the most common way to use ice therapy. Other methods include ice massage, ice baths, and cryosprays. Muscle creams that cause a cold, tingly feeling do not offer the same  benefits that ice offers and should not be used as a substitute unless recommended by your caregiver. To make an ice pack, do one of the following:  Place crushed ice or a bag of frozen vegetables in a sealable plastic bag. Squeeze out the excess air. Place this bag inside another plastic bag. Slide the bag into a pillowcase or place a damp towel between your skin and the bag.  Mix 3 parts water with 1 part rubbing alcohol. Freeze the mixture in a sealable plastic bag. When you remove the mixture from the freezer, it will be slushy. Squeeze out the excess air. Place this bag inside another plastic bag. Slide the bag into a pillowcase or place a damp towel between your skin and the bag. SEEK MEDICAL CARE IF:  You develop white spots on your skin. This may give the skin a blotchy (mottled) appearance.  Your skin turns blue or pale.  Your skin becomes waxy or  hard.  Your swelling gets worse. MAKE SURE YOU:   Understand these instructions.  Will watch your condition.  Will get help right away if you are not doing well or get worse.   This information is not intended to replace advice given to you by your health care provider. Make sure you discuss any questions you have with your health care provider.   Document Released: 10/26/2010 Document Revised: 03/22/2014 Document Reviewed: 10/26/2010 Elsevier Interactive Patient Education [employer].

## 2015-05-03 NOTE — ED Notes (Signed)
Family verbalizes understanding of instructions.

## 2016-01-14 DEATH — deceased

## 2017-02-22 IMAGING — CT CT HEAD W/O CM
1 series · 16 of 30 positions shown, 20 images · non-contrast
Comparison: None.

CLINICAL DATA: 89-year-old diabetic hypertensive male with weakness
and lower extremity pain. Initial encounter.

EXAM:
CT HEAD WITHOUT CONTRAST
TECHNIQUE: Contiguous axial images were obtained from the base of the skull
through the vertex without intravenous contrast.

[Series 2: head 4.8 h37s · axial · 0.45mm/px · z∈[-129,+27]mm · 16 of 36 slices shown, 20 images]
[im 2/36  brain]
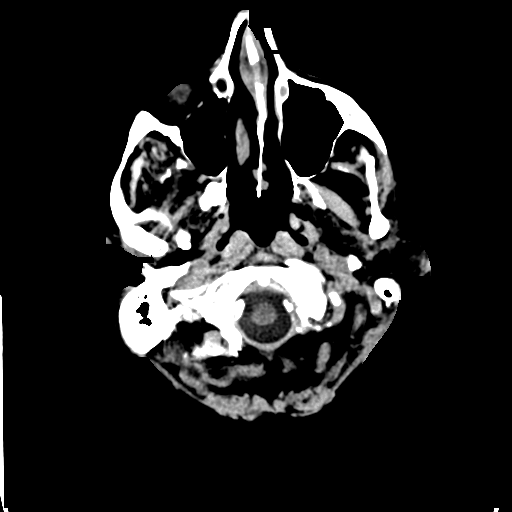
[im 2/36  bone]
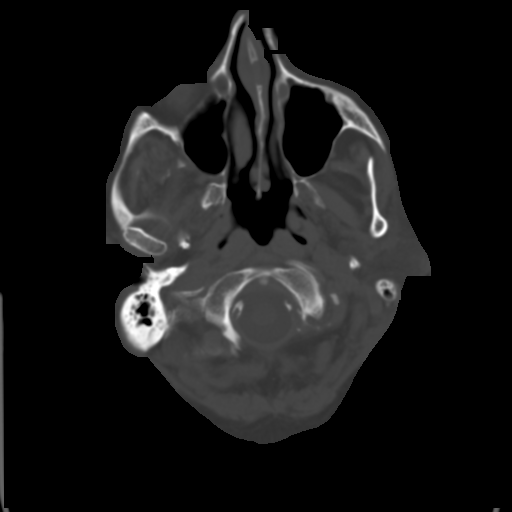
[im 4/36  brain]
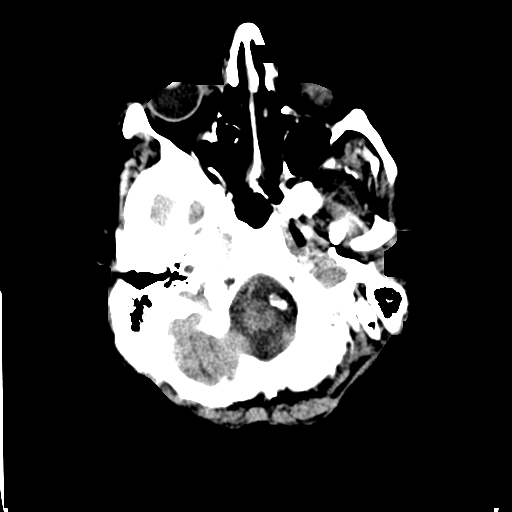
[im 7/36  brain]
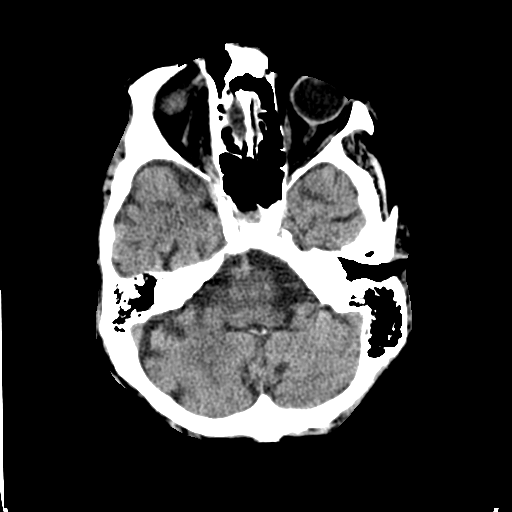
[im 9/36  brain]
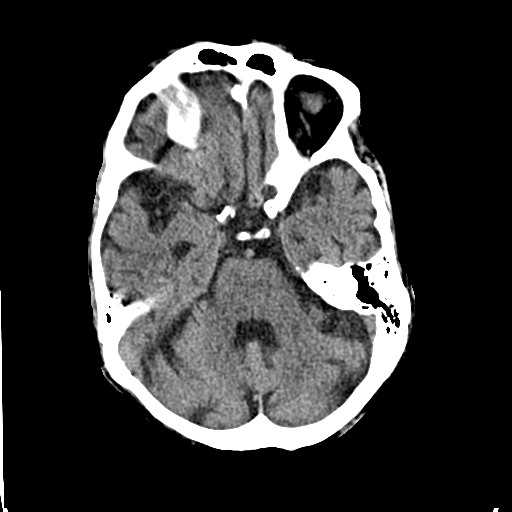
[im 10/36  brain]
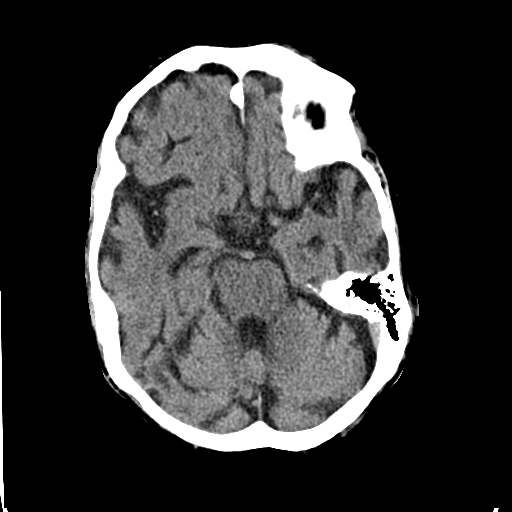
[im 10/36  bone]
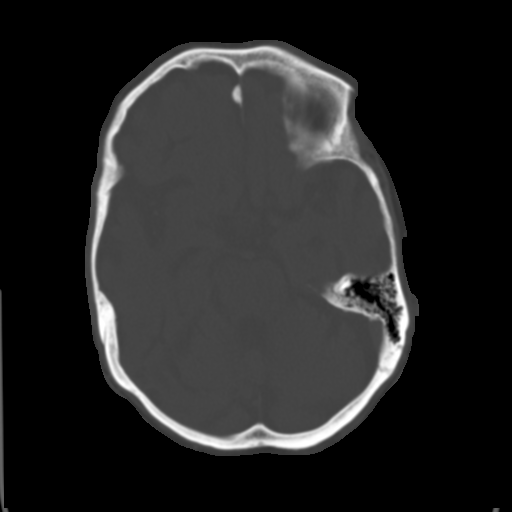
[im 13/36  brain]
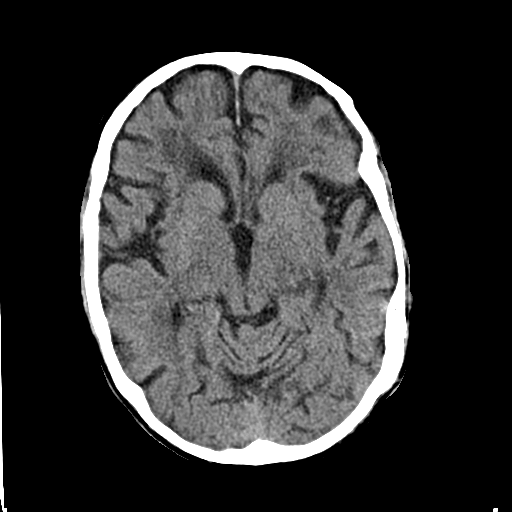
[im 15/36  brain]
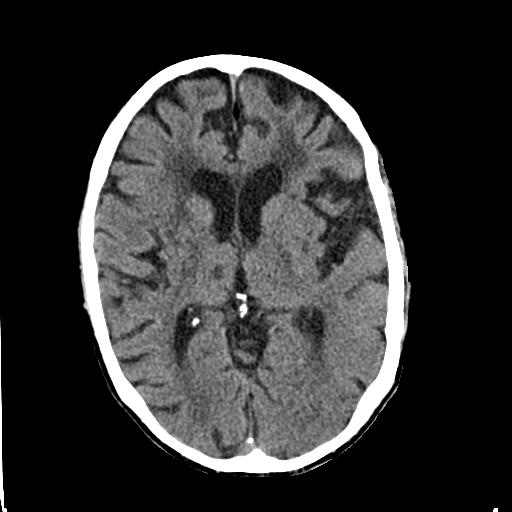
[im 17/36  brain]
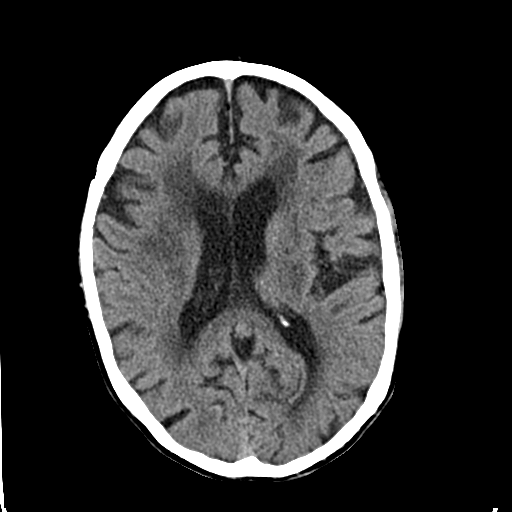
[im 19/36  brain]
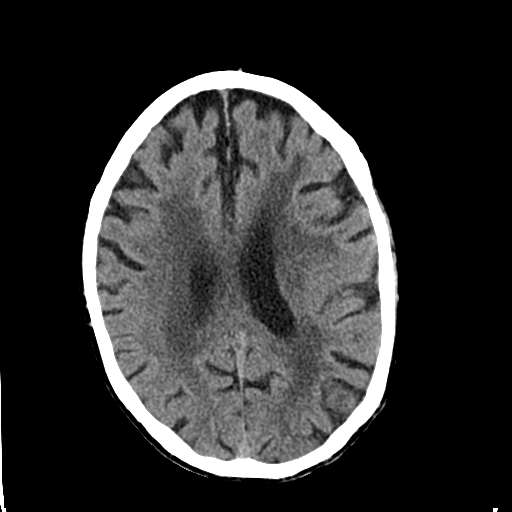
[im 19/36  bone]
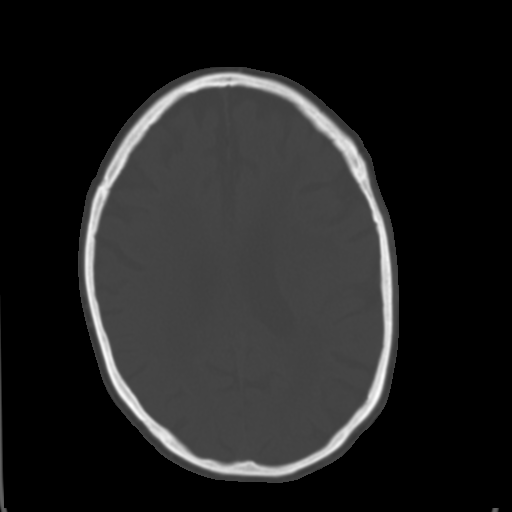
[im 21/36  brain]
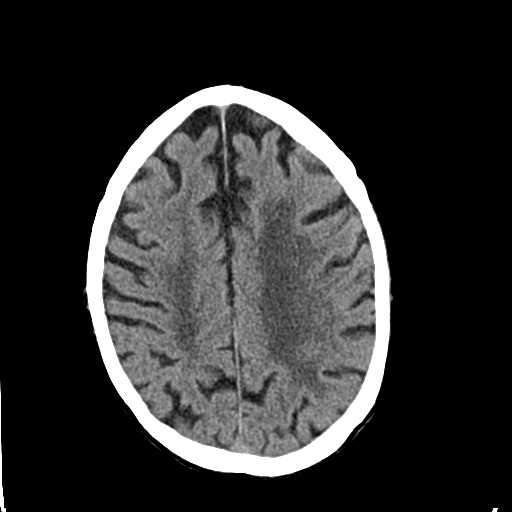
[im 23/36  brain]
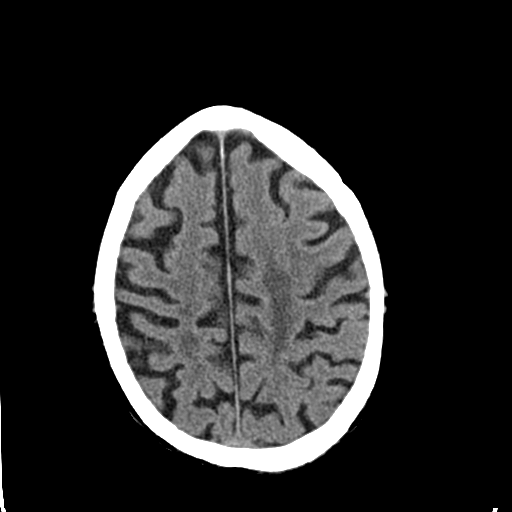
[im 26/36  brain]
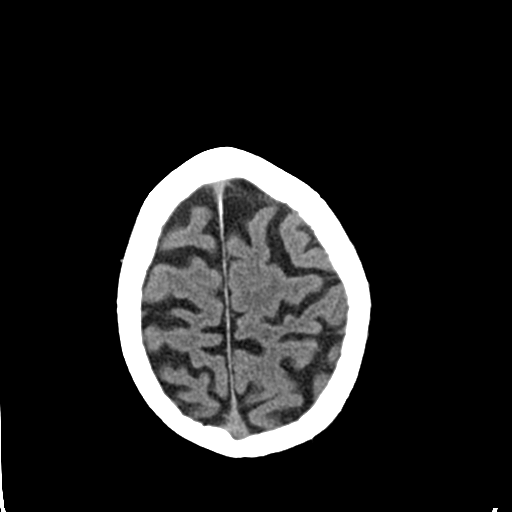
[im 27/36  brain]
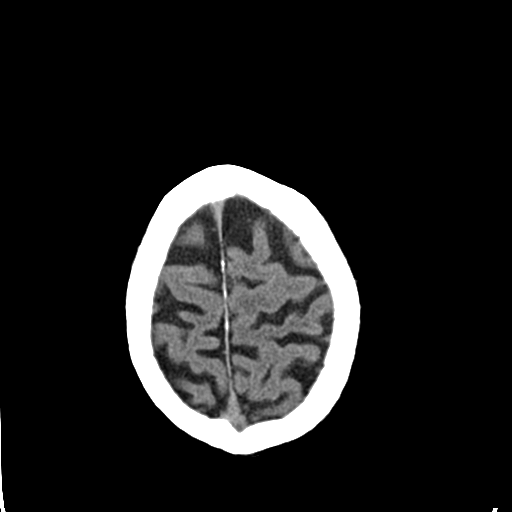
[im 27/36  bone]
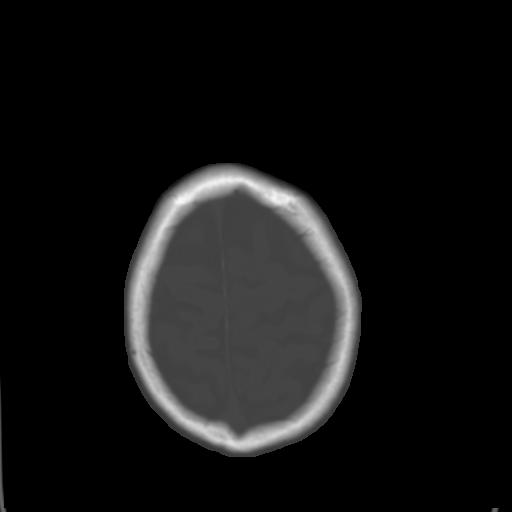
[im 29/36  brain]
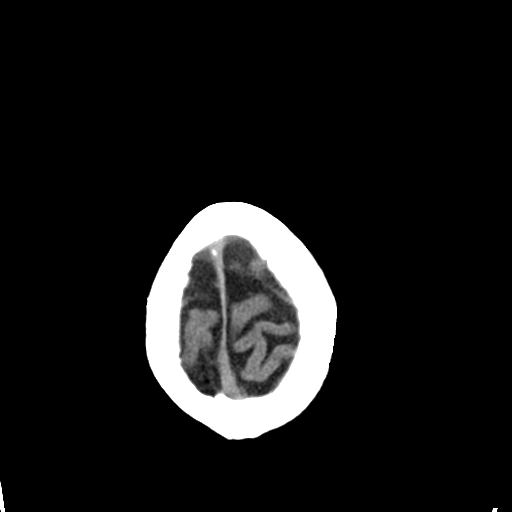
[im 32/36  brain]
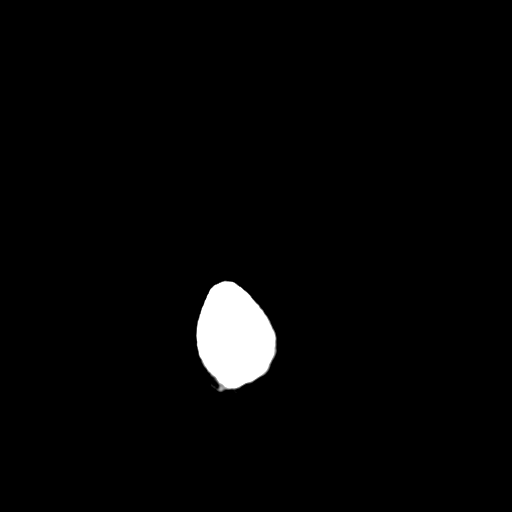
[im 34/36  brain]
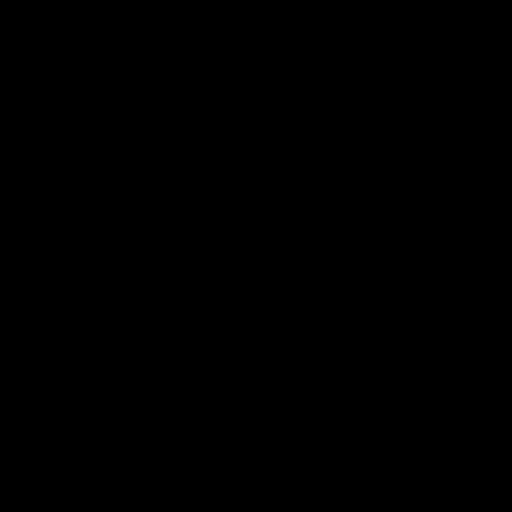

[16 of 30 positions shown; findings below may reference images not displayed]

FINDINGS: No skull fracture or intracranial hemorrhage.

Right thalamic infarcts of indeterminate age, possibly remote.

Remote left basal ganglia infarcts.

Small vessel disease type changes without CT evidence of large acute
infarct.

Global atrophy without hydrocephalus.

No intracranial mass lesion noted on this unenhanced exam.

Vascular calcifications.
IMPRESSION: Right thalamic infarcts of indeterminate age, possibly remote.

Remote left basal ganglia infarcts.

Small vessel disease type changes without CT evidence of large acute
infarct.

Global atrophy without hydrocephalus.

## 2017-02-22 IMAGING — CR DG CHEST 2V
1 series · 1 of 1 positions shown · non-contrast
Comparison: None.

CLINICAL DATA: Altered mental status. History of colon carcinoma.
Hypertension.

EXAM:
CHEST  2 VIEW

[view not recorded]
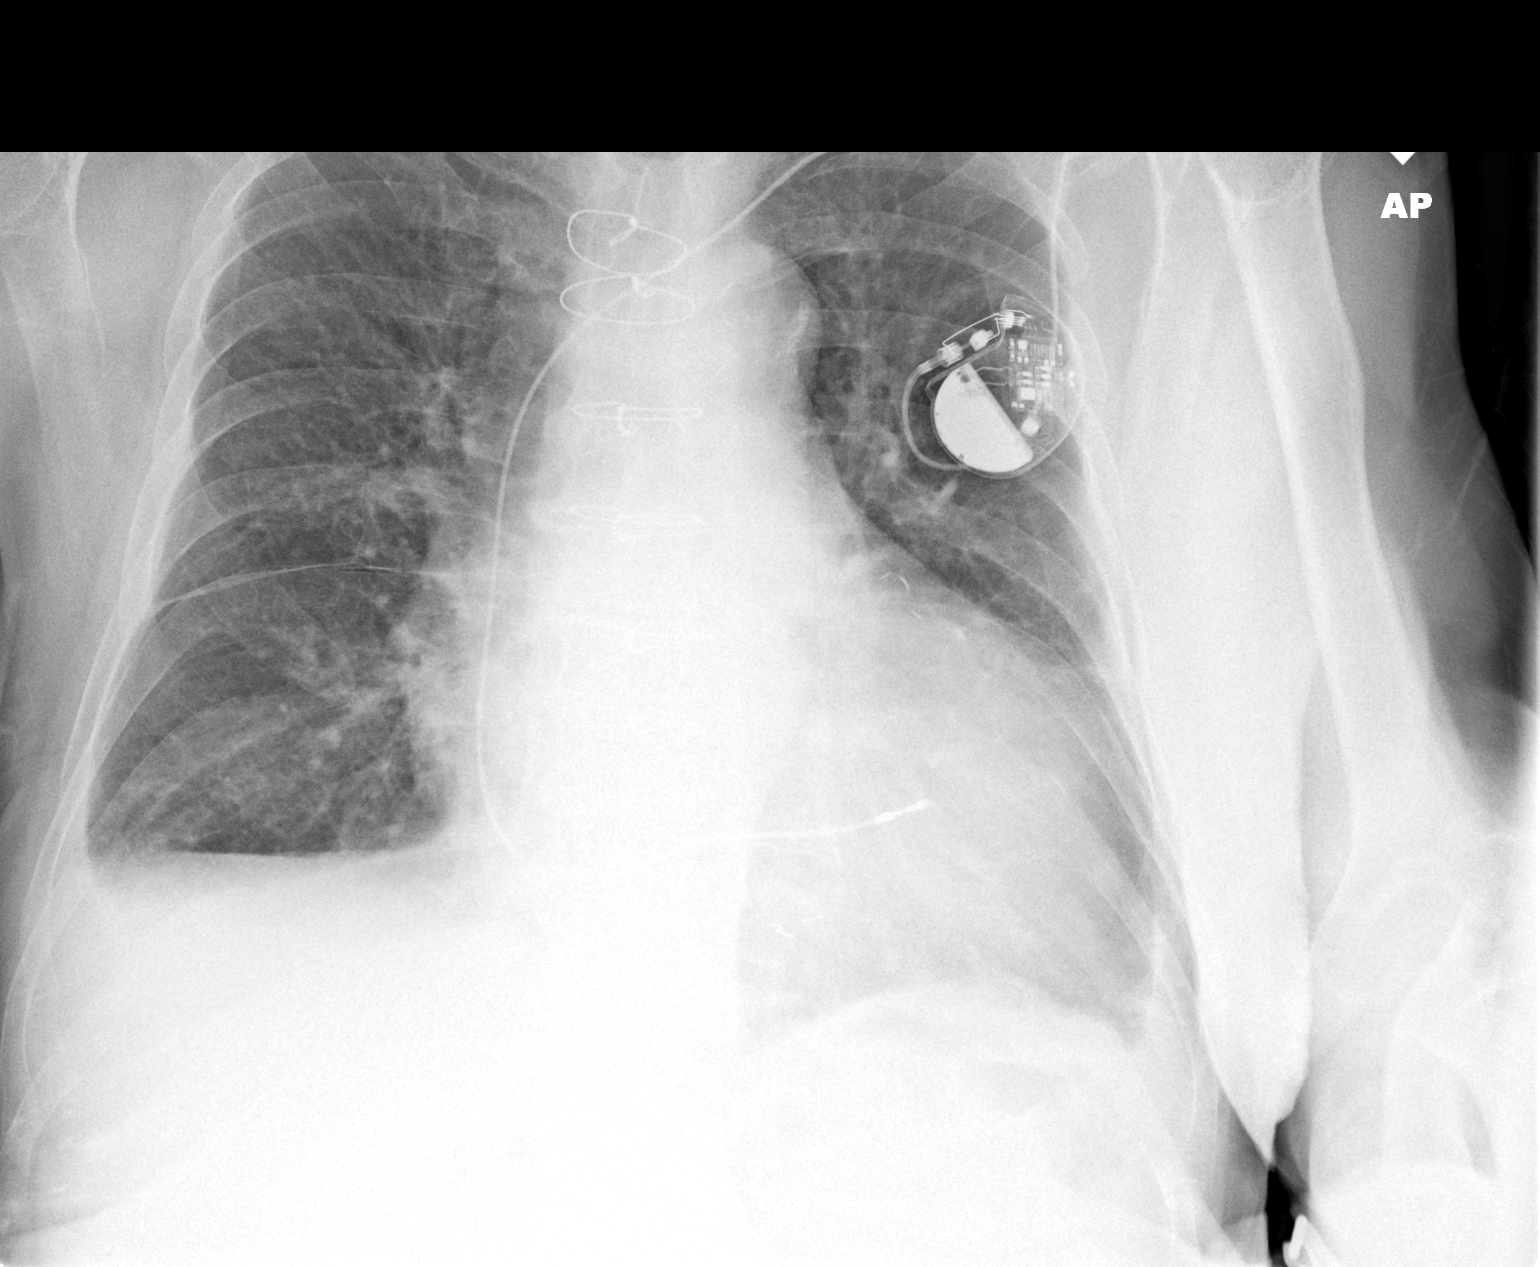

[1 of 1 positions shown; findings below may reference images not displayed]

FINDINGS: There is cardiomegaly with small pleural effusions bilaterally. No
edema or consolidation. Pacemaker lead is attached to the right
ventricle. No pneumothorax. No adenopathy. There is slight pulmonary
venous hypertension. There is atherosclerotic calcification in the
aorta. There is degenerative change in the thoracic spine.
IMPRESSION: Evidence of pulmonary vascular congestion. Small pleural effusions
bilaterally. No frank edema or consolidation.
